# Patient Record
Sex: Female | Born: 1945 | Race: White | Hispanic: No | Marital: Single | Smoking: Never smoker
Health system: Southern US, Community
[De-identification: ages and names within clinical notes are randomized; demographics above are authoritative.]

## PROBLEM LIST (undated history)

## (undated) DIAGNOSIS — R7303 Prediabetes: Secondary | ICD-10-CM

## (undated) DIAGNOSIS — I10 Essential (primary) hypertension: Secondary | ICD-10-CM

## (undated) DIAGNOSIS — F419 Anxiety disorder, unspecified: Secondary | ICD-10-CM

## (undated) DIAGNOSIS — E785 Hyperlipidemia, unspecified: Secondary | ICD-10-CM

## (undated) DIAGNOSIS — Z86010 Personal history of colonic polyps: Secondary | ICD-10-CM

## (undated) HISTORY — PX: BREAST BIOPSY: SHX20

## (undated) HISTORY — DX: Personal history of colonic polyps: Z86.010

## (undated) HISTORY — DX: Prediabetes: R73.03

## (undated) HISTORY — DX: Hyperlipidemia, unspecified: E78.5

## (undated) HISTORY — DX: Anxiety disorder, unspecified: F41.9

## (undated) HISTORY — PX: OTHER SURGICAL HISTORY: SHX169

## (undated) HISTORY — PX: COLONOSCOPY: SHX174

## (undated) HISTORY — PX: TONSILLECTOMY AND ADENOIDECTOMY: SHX28

## (undated) HISTORY — DX: Essential (primary) hypertension: I10

---

## 1998-02-11 ENCOUNTER — Other Ambulatory Visit: Admission: RE | Admit: 1998-02-11 | Discharge: 1998-02-11 | Payer: Self-pay | Admitting: *Deleted

## 1999-04-20 ENCOUNTER — Other Ambulatory Visit: Admission: RE | Admit: 1999-04-20 | Discharge: 1999-04-20 | Payer: Self-pay | Admitting: *Deleted

## 1999-09-21 ENCOUNTER — Encounter: Admission: RE | Admit: 1999-09-21 | Discharge: 1999-09-21 | Payer: Self-pay | Admitting: *Deleted

## 1999-09-21 ENCOUNTER — Encounter: Payer: Self-pay | Admitting: *Deleted

## 2000-05-16 ENCOUNTER — Other Ambulatory Visit: Admission: RE | Admit: 2000-05-16 | Discharge: 2000-05-16 | Payer: Self-pay | Admitting: *Deleted

## 2000-09-27 ENCOUNTER — Encounter: Admission: RE | Admit: 2000-09-27 | Discharge: 2000-09-27 | Payer: Self-pay | Admitting: *Deleted

## 2000-09-27 ENCOUNTER — Encounter: Payer: Self-pay | Admitting: *Deleted

## 2001-10-09 ENCOUNTER — Encounter: Admission: RE | Admit: 2001-10-09 | Discharge: 2001-10-09 | Payer: Self-pay | Admitting: Internal Medicine

## 2001-10-09 ENCOUNTER — Encounter: Payer: Self-pay | Admitting: *Deleted

## 2002-02-21 ENCOUNTER — Other Ambulatory Visit: Admission: RE | Admit: 2002-02-21 | Discharge: 2002-02-21 | Payer: Self-pay | Admitting: *Deleted

## 2003-01-08 ENCOUNTER — Encounter: Admission: RE | Admit: 2003-01-08 | Discharge: 2003-01-08 | Payer: Self-pay | Admitting: *Deleted

## 2003-01-08 ENCOUNTER — Encounter: Payer: Self-pay | Admitting: *Deleted

## 2003-02-24 ENCOUNTER — Other Ambulatory Visit: Admission: RE | Admit: 2003-02-24 | Discharge: 2003-02-24 | Payer: Self-pay | Admitting: *Deleted

## 2004-01-27 ENCOUNTER — Encounter: Admission: RE | Admit: 2004-01-27 | Discharge: 2004-01-27 | Payer: Self-pay | Admitting: *Deleted

## 2004-04-15 ENCOUNTER — Other Ambulatory Visit: Admission: RE | Admit: 2004-04-15 | Discharge: 2004-04-15 | Payer: Self-pay | Admitting: *Deleted

## 2005-02-10 ENCOUNTER — Encounter: Admission: RE | Admit: 2005-02-10 | Discharge: 2005-02-10 | Payer: Self-pay | Admitting: *Deleted

## 2005-04-18 ENCOUNTER — Other Ambulatory Visit: Admission: RE | Admit: 2005-04-18 | Discharge: 2005-04-18 | Payer: Self-pay | Admitting: *Deleted

## 2005-11-07 ENCOUNTER — Ambulatory Visit: Payer: Self-pay | Admitting: Internal Medicine

## 2005-11-21 ENCOUNTER — Encounter (INDEPENDENT_AMBULATORY_CARE_PROVIDER_SITE_OTHER): Payer: Self-pay | Admitting: Specialist

## 2005-11-21 ENCOUNTER — Ambulatory Visit: Payer: Self-pay | Admitting: Internal Medicine

## 2005-11-30 ENCOUNTER — Ambulatory Visit: Payer: Self-pay | Admitting: Internal Medicine

## 2006-01-12 ENCOUNTER — Ambulatory Visit: Payer: Self-pay | Admitting: Internal Medicine

## 2006-02-24 ENCOUNTER — Encounter: Admission: RE | Admit: 2006-02-24 | Discharge: 2006-02-24 | Payer: Self-pay | Admitting: Internal Medicine

## 2006-05-17 ENCOUNTER — Other Ambulatory Visit: Admission: RE | Admit: 2006-05-17 | Discharge: 2006-05-17 | Payer: Self-pay | Admitting: *Deleted

## 2007-02-26 ENCOUNTER — Encounter: Admission: RE | Admit: 2007-02-26 | Discharge: 2007-02-26 | Payer: Self-pay | Admitting: *Deleted

## 2007-05-23 ENCOUNTER — Other Ambulatory Visit: Admission: RE | Admit: 2007-05-23 | Discharge: 2007-05-23 | Payer: Self-pay | Admitting: *Deleted

## 2008-02-28 ENCOUNTER — Encounter: Admission: RE | Admit: 2008-02-28 | Discharge: 2008-02-28 | Payer: Self-pay | Admitting: Gynecology

## 2008-05-22 ENCOUNTER — Other Ambulatory Visit: Admission: RE | Admit: 2008-05-22 | Discharge: 2008-05-22 | Payer: Self-pay | Admitting: Gynecology

## 2009-03-02 ENCOUNTER — Encounter: Admission: RE | Admit: 2009-03-02 | Discharge: 2009-03-02 | Payer: Self-pay | Admitting: Gynecology

## 2010-03-23 ENCOUNTER — Encounter: Admission: RE | Admit: 2010-03-23 | Discharge: 2010-03-23 | Payer: Self-pay | Admitting: Gynecology

## 2010-03-29 ENCOUNTER — Encounter: Admission: RE | Admit: 2010-03-29 | Discharge: 2010-03-29 | Payer: Self-pay | Admitting: Gynecology

## 2010-12-02 ENCOUNTER — Encounter: Payer: Self-pay | Admitting: Internal Medicine

## 2010-12-07 NOTE — Letter (Signed)
Summary: Colonoscopy Letter   Gastroenterology  [address]   [place], [place] [address]   Phone: [phone]  Fax: [phone]      December 02, 2010 MRN: [id number]   Kylie Bird [address] [place], [place]  [address]   Dear Ms. Males,   According to your medical record, it is time for you to schedule a Colonoscopy. The American Cancer Society recommends this procedure as a method to detect early colon cancer. Patients with a family history of colon cancer, or a personal history of colon polyps or inflammatory bowel disease are at increased risk.  This letter has been generated based on the recommendations made at the time of your procedure. If you feel that in your particular situation this may no longer apply, please contact our office.  Please call our office at [phone] to schedule this appointment or to update your records at your earliest convenience.  Thank you for cooperating with [place] to provide you with the very best care possible.   Sincerely,   Stan Head, M.D.  [facility] [phone]

## 2011-03-03 ENCOUNTER — Other Ambulatory Visit: Payer: Self-pay | Admitting: Gynecology

## 2011-03-03 DIAGNOSIS — Z1231 Encounter for screening mammogram for malignant neoplasm of breast: Secondary | ICD-10-CM

## 2011-03-31 ENCOUNTER — Ambulatory Visit
Admission: RE | Admit: 2011-03-31 | Discharge: 2011-03-31 | Disposition: A | Discharge status: Home / Self Care | Payer: BC Managed Care – PPO | Source: Ambulatory Visit | Attending: Gynecology | Admitting: Gynecology

## 2011-03-31 DIAGNOSIS — Z1231 Encounter for screening mammogram for malignant neoplasm of breast: Secondary | ICD-10-CM

## 2011-04-05 ENCOUNTER — Other Ambulatory Visit: Payer: Self-pay | Admitting: Gynecology

## 2011-04-05 DIAGNOSIS — R928 Other abnormal and inconclusive findings on diagnostic imaging of breast: Secondary | ICD-10-CM

## 2011-04-07 ENCOUNTER — Ambulatory Visit
Admission: RE | Admit: 2011-04-07 | Discharge: 2011-04-07 | Disposition: A | Discharge status: Home / Self Care | Payer: BC Managed Care – PPO | Source: Ambulatory Visit | Attending: Gynecology | Admitting: Gynecology

## 2011-04-07 DIAGNOSIS — R928 Other abnormal and inconclusive findings on diagnostic imaging of breast: Secondary | ICD-10-CM

## 2011-06-28 ENCOUNTER — Other Ambulatory Visit: Payer: Medicare Other | Admitting: Internal Medicine

## 2011-06-28 IMAGING — MG MM DIGITAL DIAG LTD R
2 series · 2 of 2 positions shown · non-contrast
Comparison: 03/02/2009, 02/28/2008, 02/26/2007, 02/24/2006

CLINICAL DATA: The patient returns for evaluation of
calcifications in the right breast noted on recent screening study
dated 03/23/2010.

DIGITAL DIAGNOSTIC RIGHT LIMITED MAMMOGRAM WITH CAD

[R CC]
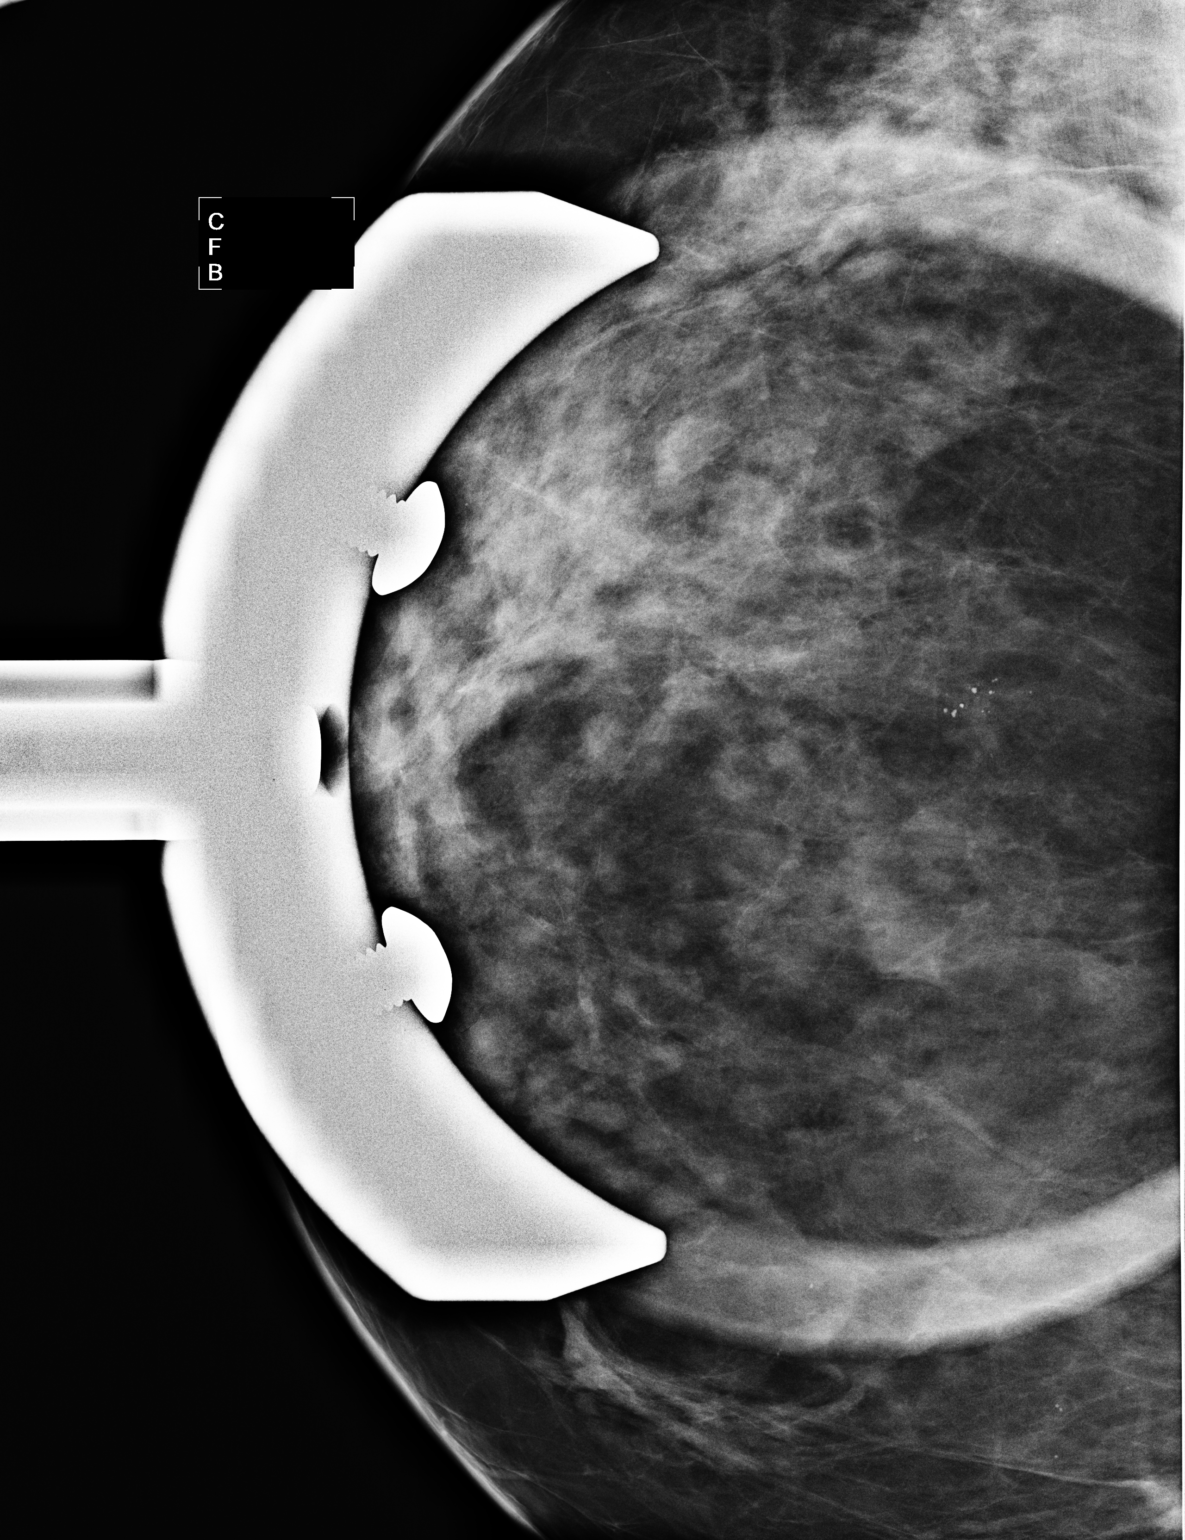

[R ML]
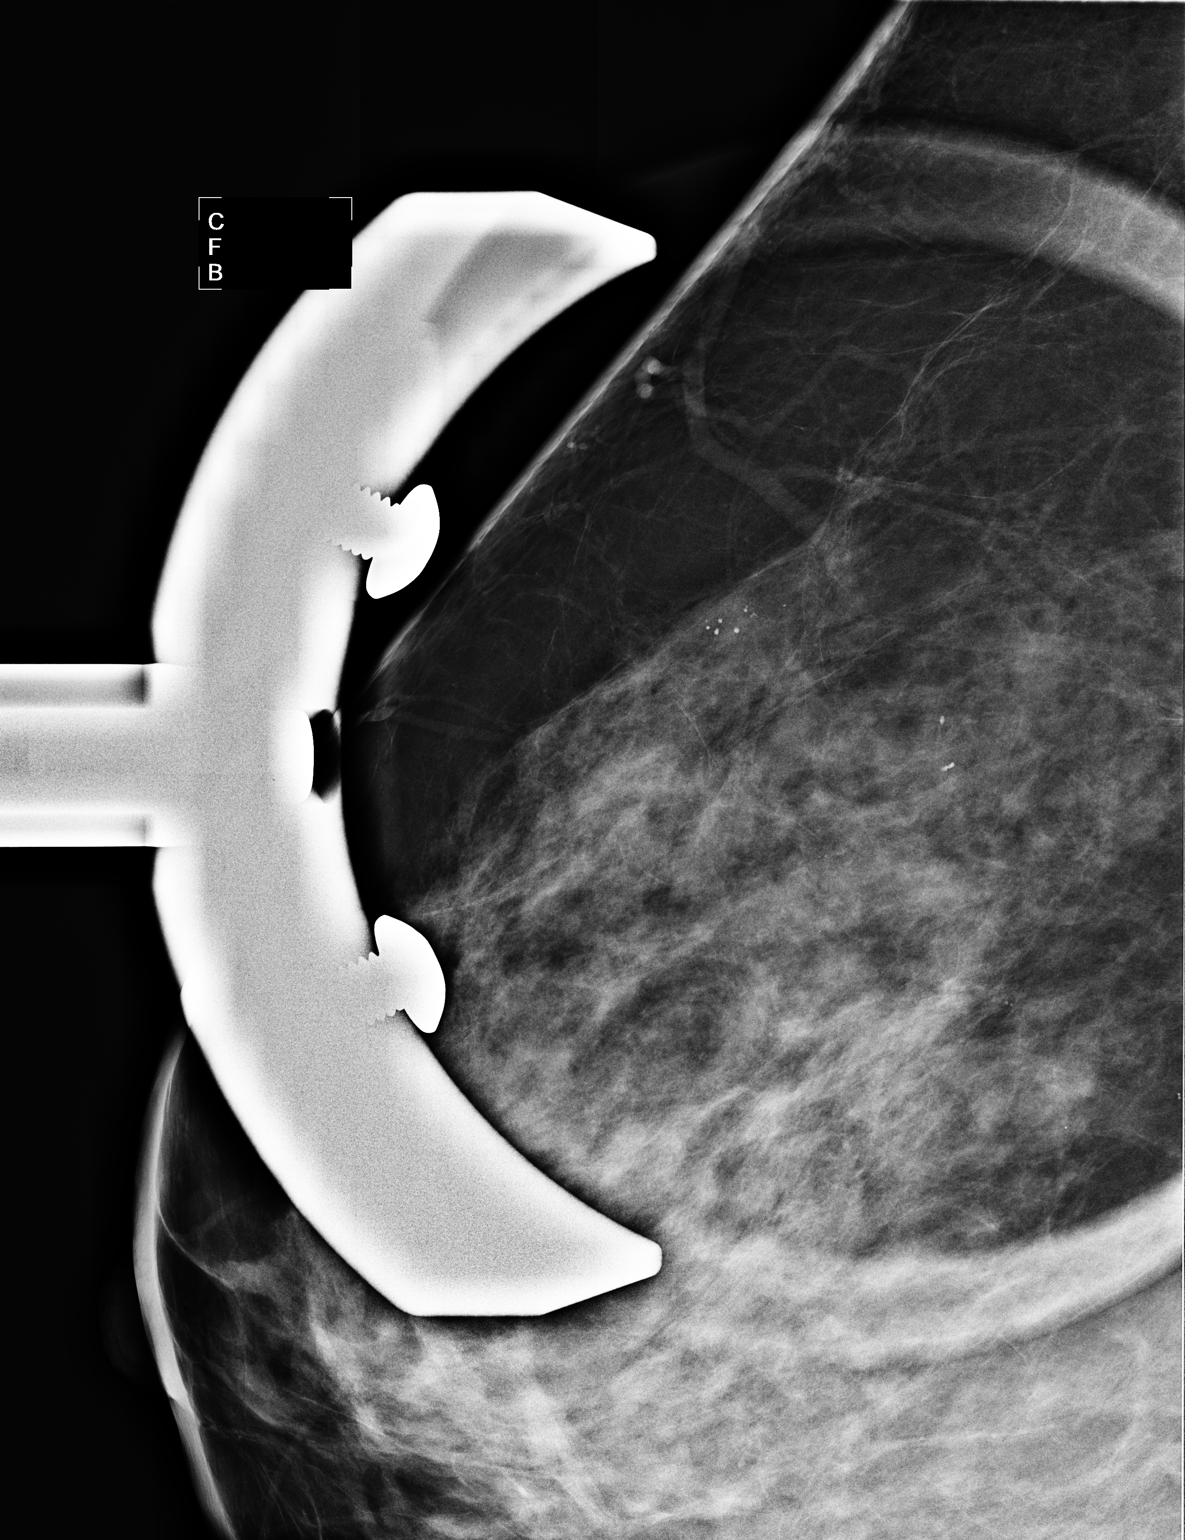

[2 of 2 positions shown; findings below may reference images not displayed]

FINDINGS: Magnification views demonstrate a small cluster of
pleomorphic microcalcifications in the 12 o'clock position of the
right breast.  The appearance is slightly suspicious and biopsy is
suggested.

Stereotactic core needle biopsy was discussed with the patient and
she agreed with this plan.
IMPRESSION: Slightly suspicious cluster of pleomorphic microcalcifications in
the 12 o'clock position of the right breast.  Stereotactic core
needle biopsy will be performed and reported separately.

BI-RADS CATEGORY 4:  Suspicious abnormality - biopsy should be
considered.

## 2011-07-26 ENCOUNTER — Ambulatory Visit (AMBULATORY_SURGERY_CENTER): Payer: BC Managed Care – PPO

## 2011-07-26 VITALS — Ht 63.0 in | Wt 152.4 lb

## 2011-07-26 DIAGNOSIS — Z860100 Personal history of colon polyps, unspecified: Secondary | ICD-10-CM

## 2011-07-26 MED ORDER — PEG-KCL-NACL-NASULF-NA ASC-C 100 G PO SOLR: 1.0000 | Freq: Once | ORAL | Status: AC

## 2011-08-09 ENCOUNTER — Ambulatory Visit (AMBULATORY_SURGERY_CENTER): Payer: Medicare Other | Admitting: Internal Medicine

## 2011-08-09 ENCOUNTER — Encounter: Payer: Self-pay | Admitting: Internal Medicine

## 2011-08-09 VITALS — BP 136/80 | HR 66 | Temp 97.0°F | Resp 16 | Ht 63.0 in | Wt 152.0 lb

## 2011-08-09 DIAGNOSIS — D126 Benign neoplasm of colon, unspecified: Secondary | ICD-10-CM

## 2011-08-09 DIAGNOSIS — Z860100 Personal history of colon polyps, unspecified: Secondary | ICD-10-CM

## 2011-08-09 DIAGNOSIS — Z1211 Encounter for screening for malignant neoplasm of colon: Secondary | ICD-10-CM

## 2011-08-09 MED ORDER — SODIUM CHLORIDE 0.9 % IV SOLN: 500.0000 mL | INTRAVENOUS | Status: DC

## 2011-08-09 NOTE — Patient Instructions (Addendum)
3 small polyps were removed today. They certainly looked benign. I will send you a letter about the results and recommendations for the next routine preventive colonoscopy. Iva Boop, MD, [facility]  SEE GREEN AND BLUE SHEETS FOR ADDITIONAL D/C INSTRUCTIONS

## 2011-08-09 NOTE — Progress Notes (Signed)
Same last name sign hanging on the IV pole. Maw  Pt tolerated the colonoscopy very well. maw

## 2011-08-09 NOTE — Progress Notes (Signed)
Patient did not experience any of the following events: a burn prior to discharge; a fall within the facility; wrong site/side/patient/procedure/implant event; or a hospital transfer or hospital admission upon discharge from the facility. (G8907) Patient did not have preoperative order for IV antibiotic SSI prophylaxis. (G8918)  

## 2011-08-10 ENCOUNTER — Telehealth: Payer: Self-pay | Admitting: *Deleted

## 2011-08-10 NOTE — Telephone Encounter (Signed)
No ID on voice mail.   No message left. 

## 2011-08-16 NOTE — Progress Notes (Signed)
Quick Note:  Adenoma and serrated adenoma - all small Repeat routine colonoscopy approx 07/2016 ______

## 2012-03-07 ENCOUNTER — Other Ambulatory Visit: Payer: Self-pay | Admitting: Gynecology

## 2012-03-07 DIAGNOSIS — Z1231 Encounter for screening mammogram for malignant neoplasm of breast: Secondary | ICD-10-CM

## 2012-04-03 ENCOUNTER — Ambulatory Visit
Admission: RE | Admit: 2012-04-03 | Discharge: 2012-04-03 | Disposition: A | Discharge status: Home / Self Care | Payer: Medicare Other | Source: Ambulatory Visit | Attending: Gynecology | Admitting: Gynecology

## 2012-04-03 DIAGNOSIS — Z1231 Encounter for screening mammogram for malignant neoplasm of breast: Secondary | ICD-10-CM

## 2013-03-08 ENCOUNTER — Other Ambulatory Visit: Payer: Self-pay

## 2013-03-08 DIAGNOSIS — Z1231 Encounter for screening mammogram for malignant neoplasm of breast: Secondary | ICD-10-CM

## 2013-04-05 ENCOUNTER — Ambulatory Visit
Admission: RE | Admit: 2013-04-05 | Discharge: 2013-04-05 | Disposition: A | Discharge status: Home / Self Care | Payer: Medicare Other | Source: Ambulatory Visit

## 2013-04-05 DIAGNOSIS — Z1231 Encounter for screening mammogram for malignant neoplasm of breast: Secondary | ICD-10-CM

## 2014-03-10 ENCOUNTER — Other Ambulatory Visit: Payer: Self-pay

## 2014-03-10 DIAGNOSIS — Z1231 Encounter for screening mammogram for malignant neoplasm of breast: Secondary | ICD-10-CM

## 2014-04-08 ENCOUNTER — Encounter (INDEPENDENT_AMBULATORY_CARE_PROVIDER_SITE_OTHER): Payer: Self-pay

## 2014-04-08 ENCOUNTER — Ambulatory Visit
Admission: RE | Admit: 2014-04-08 | Discharge: 2014-04-08 | Disposition: A | Discharge status: Home / Self Care | Payer: 59 | Source: Ambulatory Visit

## 2014-04-08 DIAGNOSIS — Z1231 Encounter for screening mammogram for malignant neoplasm of breast: Secondary | ICD-10-CM

## 2014-04-14 ENCOUNTER — Encounter: Payer: Self-pay | Admitting: Internal Medicine

## 2014-04-26 ENCOUNTER — Ambulatory Visit (INDEPENDENT_AMBULATORY_CARE_PROVIDER_SITE_OTHER): Payer: PRIVATE HEALTH INSURANCE | Admitting: Family Medicine

## 2014-04-26 VITALS — BP 176/90 | HR 70 | Temp 97.8°F | Resp 16 | Ht 63.0 in | Wt 155.8 lb

## 2014-04-26 DIAGNOSIS — R350 Frequency of micturition: Secondary | ICD-10-CM

## 2014-04-26 DIAGNOSIS — N3000 Acute cystitis without hematuria: Secondary | ICD-10-CM

## 2014-04-26 LAB — POCT UA - MICROSCOPIC ONLY
Casts, Ur, LPF, POC: NEGATIVE
Crystals, Ur, HPF, POC: NEGATIVE
Mucus, UA: NEGATIVE
Yeast, UA: NEGATIVE

## 2014-04-26 LAB — POCT URINALYSIS DIPSTICK
Bilirubin, UA: NEGATIVE
Glucose, UA: NEGATIVE
Ketones, UA: NEGATIVE
Nitrite, UA: NEGATIVE
Protein, UA: NEGATIVE
Spec Grav, UA: 1.01
Urobilinogen, UA: 0.2
pH, UA: 7

## 2014-04-26 MED ORDER — NITROFURANTOIN MONOHYD MACRO 100 MG PO CAPS: 100.0000 mg | ORAL_CAPSULE | Freq: Two times a day (BID) | ORAL | Status: DC

## 2014-04-26 NOTE — Patient Instructions (Signed)
Take the nitrofurantoin one twice daily for infection  Drink plenty of fluids  Take the azo for the next couple of days, 3 times daily  Return if worse or not improving  Urinary Tract Infection Urinary tract infections (UTIs) can develop anywhere along your urinary tract. Your urinary tract is your body's drainage system for removing wastes and extra water. Your urinary tract includes two kidneys, two ureters, a bladder, and a urethra. Your kidneys are a pair of bean-shaped organs. Each kidney is about the size of your fist. They are located below your ribs, one on each side of your spine. CAUSES Infections are caused by microbes, which are microscopic organisms, including fungi, viruses, and bacteria. These organisms are so small that they can only be seen through a microscope. Bacteria are the microbes that most commonly cause UTIs. SYMPTOMS  Symptoms of UTIs may vary by age and gender of the patient and by the location of the infection. Symptoms in young women typically include a frequent and intense urge to urinate and a painful, burning feeling in the bladder or urethra during urination. Older women and men are more likely to be tired, shaky, and weak and have muscle aches and abdominal pain. A fever may mean the infection is in your kidneys. Other symptoms of a kidney infection include pain in your back or sides below the ribs, nausea, and vomiting. DIAGNOSIS To diagnose a UTI, your caregiver will ask you about your symptoms. Your caregiver also will ask to provide a urine sample. The urine sample will be tested for bacteria and white blood cells. White blood cells are made by your body to help fight infection. TREATMENT  Typically, UTIs can be treated with medication. Because most UTIs are caused by a bacterial infection, they usually can be treated with the use of antibiotics. The choice of antibiotic and length of treatment depend on your symptoms and the type of bacteria causing your  infection. HOME CARE INSTRUCTIONS  If you were prescribed antibiotics, take them exactly as your caregiver instructs you. Finish the medication even if you feel better after you have only taken some of the medication.  Drink enough water and fluids to keep your urine clear or pale yellow.  Avoid caffeine, tea, and carbonated beverages. They tend to irritate your bladder.  Empty your bladder often. Avoid holding urine for long periods of time.  Empty your bladder before and after sexual intercourse.  After a bowel movement, women should cleanse from front to back. Use each tissue only once. SEEK MEDICAL CARE IF:   You have back pain.  You develop a fever.  Your symptoms do not begin to resolve within 3 days. SEEK IMMEDIATE MEDICAL CARE IF:   You have severe back pain or lower abdominal pain.  You develop chills.  You have nausea or vomiting.  You have continued burning or discomfort with urination. MAKE SURE YOU:   Understand these instructions.  Will watch your condition.  Will get help right away if you are not doing well or get worse. Document Released: 06/15/2005 Document Revised: 03/06/2012 Document Reviewed: 10/14/2011 [facility] Patient Information 2015 [place], [place]. This information is not intended to replace advice given to you by your health care provider. Make sure you discuss any questions you have with your health care provider.

## 2014-04-26 NOTE — Progress Notes (Signed)
Subjective: 68 year old lady who has a four-day history of urinary hesitancy and dysuria. She does work at [employer]. Part-time said she is retired. She's not sexually involved. Is single. No history of urinary tract infections. Wants to know what causes them.  Objective: Very pleasant lady in no acute distress. No CVA tenderness. Abdomen soft with mild suprapubic tenderness.    Results for orders placed in visit on 04/26/14  POCT URINALYSIS DIPSTICK      Result Value Ref Range   Color, UA yellow     Clarity, UA hazy     Glucose, UA neg     Bilirubin, UA neg     Ketones, UA neg     Spec Grav, UA 1.010     Blood, UA moderate     pH, UA 7.0     Protein, UA neg     Urobilinogen, UA 0.2     Nitrite, UA neg     Leukocytes, UA moderate (2+)    POCT UA - MICROSCOPIC ONLY      Result Value Ref Range   WBC, Ur, HPF, POC 20-25     RBC, urine, microscopic 8-12     Bacteria, U Microscopic 1+     Mucus, UA neg     Epithelial cells, urine per micros 2-3     Crystals, Ur, HPF, POC neg     Casts, Ur, LPF, POC neg     Yeast, UA neg     Assessment: Urinary tract infection Dysuria  Plan: Nitrofurantoin Urine culture

## 2014-04-28 LAB — URINE CULTURE: Colony Count: >=100000

## 2014-11-05 ENCOUNTER — Encounter: Payer: Self-pay | Admitting: Podiatry

## 2014-11-05 ENCOUNTER — Ambulatory Visit (INDEPENDENT_AMBULATORY_CARE_PROVIDER_SITE_OTHER): Payer: Medicare Other | Admitting: Podiatry

## 2014-11-05 VITALS — BP 157/87 | HR 78 | Resp 16 | Ht 64.0 in | Wt 154.0 lb

## 2014-11-05 DIAGNOSIS — L600 Ingrowing nail: Secondary | ICD-10-CM

## 2014-11-05 DIAGNOSIS — B351 Tinea unguium: Secondary | ICD-10-CM

## 2014-11-05 NOTE — Progress Notes (Signed)
   Subjective:    Patient ID: Kylie Bird, female    DOB: Jul 07, 1946, 69 y.o.   MRN: [id number]  HPI Comments: "I have a problem with the toenails"  Patient c/o thick, discolored 1st and 5th toenails bilateral for several months. The 1st nail right is starting to crack. Been using tea tree oil.     Review of Systems  HENT: Positive for sinus pressure.   Allergic/Immunologic: Positive for environmental allergies.  All other systems reviewed and are negative.      Objective:   Physical Exam        Assessment & Plan:

## 2014-11-05 NOTE — Progress Notes (Signed)
Subjective:     Patient ID: Kylie Bird, female   DOB: 03-31-1946, 69 y.o.   MRN: [id number]  HPI patient states that the right big toenail is starting to crack it's thick and it's concerned that it may be ingrown. She does not remember specific trauma and states she also gets some yellow discoloration of the little nails but they are not cracked   Review of Systems  All other systems reviewed and are negative.      Objective:   Physical Exam  Cardiovascular: Intact distal pulses.   Musculoskeletal: Normal range of motion.  Neurological: She is alert.  Skin: Skin is warm and dry.  Nursing note and vitals reviewed.  neurovascular status intact with muscle strength adequate and range of motion subtalar midtarsal joint within normal limits. Patient is noted to have a crack right hallux nail on the distal one half with yellow like discoloration that is present and no erythema or drainage noted. 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 Discussed nature of problem and today we will start topical antifungal treatment and allow it to grow out. Explained that she may lose the nail and that it may become deformed long-term and she may require laser or other treatment

## 2015-03-11 ENCOUNTER — Other Ambulatory Visit: Payer: Self-pay

## 2015-03-11 DIAGNOSIS — Z1231 Encounter for screening mammogram for malignant neoplasm of breast: Secondary | ICD-10-CM

## 2015-04-15 ENCOUNTER — Ambulatory Visit
Admission: RE | Admit: 2015-04-15 | Discharge: 2015-04-15 | Disposition: A | Discharge status: Home / Self Care | Payer: Medicare Other | Source: Ambulatory Visit

## 2015-04-15 DIAGNOSIS — Z1231 Encounter for screening mammogram for malignant neoplasm of breast: Secondary | ICD-10-CM

## 2015-06-17 ENCOUNTER — Ambulatory Visit (INDEPENDENT_AMBULATORY_CARE_PROVIDER_SITE_OTHER): Payer: Medicare Other | Admitting: Physician Assistant

## 2015-06-17 VITALS — BP 168/92 | HR 82 | Temp 98.2°F | Resp 18 | Ht 62.5 in | Wt 161.0 lb

## 2015-06-17 DIAGNOSIS — N3000 Acute cystitis without hematuria: Secondary | ICD-10-CM

## 2015-06-17 DIAGNOSIS — J019 Acute sinusitis, unspecified: Secondary | ICD-10-CM | POA: Diagnosis not present

## 2015-06-17 DIAGNOSIS — R300 Dysuria: Secondary | ICD-10-CM

## 2015-06-17 LAB — POCT URINALYSIS DIP (MANUAL ENTRY)
BILIRUBIN UA: NEGATIVE
Bilirubin, UA: NEGATIVE
GLUCOSE UA: NEGATIVE
Nitrite, UA: NEGATIVE
Protein Ur, POC: NEGATIVE
SPEC GRAV UA: 1.01
Urobilinogen, UA: 0.2
pH, UA: 7.5

## 2015-06-17 LAB — POC MICROSCOPIC URINALYSIS (UMFC)

## 2015-06-17 MED ORDER — NITROFURANTOIN MONOHYD MACRO 100 MG PO CAPS: 100.0000 mg | ORAL_CAPSULE | Freq: Two times a day (BID) | ORAL | Status: DC

## 2015-06-17 MED ORDER — AZITHROMYCIN 250 MG PO TABS: ORAL_TABLET | ORAL | Status: DC

## 2015-06-17 NOTE — Progress Notes (Signed)
Subjective:    Patient ID: Kylie Bird, female    DOB: 08/08/1946, 69 y.o.   MRN: [id number]  HPI Patient presents for congestion and cough that have been present for the past 2 weeks. States that she mostly has postnasal drip and has her voice sounding raspy. Has some rhinorrhea. Cough is mostly dry. Additionally endorses sinus pressure. Denies HA, otalgia, SOB, CP, N/V, fever, or wheezing. Has seasonal allergies that she is taking flonase and zyrtec for. Additionally has been using salt water in nasal passages without relief.   Adds that she had dysuria this morning, as well as, urinary frequency. Took Azo with marginal relief. Denies urgency, decreased volume, hematuria, vaginal discharge, or flank pain. Denies current sexual intercourse.  Allergies  Allergen Reactions  . Amoxicillin-Pot Clavulanate Hives, Itching and Swelling  . Penicillins Hives, Itching and Swelling  . Sulfa Antibiotics Hives, Itching and Swelling    Review of Systems As noted above.    Objective:   Physical Exam  Constitutional: She is oriented to person, place, and time. She appears well-developed and well-nourished. No distress.  Blood pressure 168/92, pulse 82, temperature 98.2 F (36.8 C), temperature source Oral, resp. rate 18, height 5' 2.5" (1.588 m), weight 161 lb (73.029 kg), SpO2 98 %.   HENT:  Head: Normocephalic and atraumatic.  Right Ear: Tympanic membrane, external ear and ear canal normal.  Left Ear: Tympanic membrane, external ear and ear canal normal.  Nose: Rhinorrhea (with erythema) present. Right sinus exhibits maxillary sinus tenderness. Right sinus exhibits no frontal sinus tenderness. Left sinus exhibits maxillary sinus tenderness. Left sinus exhibits no frontal sinus tenderness.  Mouth/Throat: Uvula is midline, oropharynx is clear and moist and mucous membranes are normal. No oropharyngeal exudate.  Eyes: Conjunctivae are normal. Pupils are equal, round, and reactive to light. Right  eye exhibits no discharge. Left eye exhibits no discharge. No scleral icterus.  Neck: Normal range of motion. Neck supple. No thyromegaly present.  Cardiovascular: Normal rate, regular rhythm and normal heart sounds.  Exam reveals no gallop and no friction rub.   No murmur heard. Pulmonary/Chest: Effort normal and breath sounds normal. No respiratory distress. She has no decreased breath sounds. She has no wheezes. She has no rhonchi. She has no rales.  Abdominal: Soft. Bowel sounds are normal. She exhibits no distension. There is no tenderness. There is no rebound and no guarding.  Lymphadenopathy:    She has no cervical adenopathy.  Neurological: She is alert and oriented to person, place, and time.  Skin: Skin is warm and dry. No rash noted. She is not diaphoretic. No erythema.   Results for orders placed or performed in visit on 06/17/15  POCT urinalysis dipstick  Result Value Ref Range   Color, UA yellow yellow   Clarity, UA clear clear   Glucose, UA negative negative   Bilirubin, UA negative negative   Ketones, POC UA negative negative   Spec Grav, UA 1.010    Blood, UA trace-intact (A) negative   pH, UA 7.5    Protein Ur, POC negative negative   Urobilinogen, UA 0.2    Nitrite, UA Negative Negative   Leukocytes, UA small (1+) (A) Negative  POCT Microscopic Urinalysis ([facility])  Result Value Ref Range   WBC,UR,HPF,POC Moderate (A) None WBC/hpf   RBC,UR,HPF,POC Few (A) None RBC/hpf   Bacteria Few (A) None   Mucus Present (A) Absent   Epithelial Cells, UR Per Microscopy Few (A) None cells/hpf  Assessment & Plan:  1. Burning with urination 2. Acute cystitis without hematuria Increase water intake.  - POCT urinalysis dipstick - POCT Microscopic Urinalysis ([facility]) - nitrofurantoin, macrocrystal-monohydrate, (MACROBID) 100 MG capsule; Take 1 capsule (100 mg total) by mouth 2 (two) times daily.  Dispense: 14 capsule; Refill: 0  3. Acute sinusitis, recurrence not specified,  unspecified location - azithromycin (ZITHROMAX) 250 MG tablet; Take 2 tabs PO x 1 dose, then 1 tab PO QD x 4 days  Dispense: 6 tablet; Refill: 0   Tishira Brewington PA-C  [facility] 06/23/2015 11:45 AM

## 2015-06-23 ENCOUNTER — Encounter: Payer: Self-pay | Admitting: Physician Assistant

## 2016-04-13 ENCOUNTER — Other Ambulatory Visit: Payer: Self-pay | Admitting: Internal Medicine

## 2016-04-13 DIAGNOSIS — Z1231 Encounter for screening mammogram for malignant neoplasm of breast: Secondary | ICD-10-CM

## 2016-04-21 ENCOUNTER — Ambulatory Visit
Admission: RE | Admit: 2016-04-21 | Discharge: 2016-04-21 | Disposition: A | Discharge status: Home / Self Care | Payer: Medicare Other | Source: Ambulatory Visit | Attending: Internal Medicine | Admitting: Internal Medicine

## 2016-04-21 DIAGNOSIS — Z1231 Encounter for screening mammogram for malignant neoplasm of breast: Secondary | ICD-10-CM

## 2016-08-19 ENCOUNTER — Encounter: Payer: Self-pay | Admitting: Internal Medicine

## 2016-08-22 ENCOUNTER — Encounter: Payer: Self-pay | Admitting: Internal Medicine

## 2016-10-11 ENCOUNTER — Ambulatory Visit (AMBULATORY_SURGERY_CENTER): Payer: Self-pay

## 2016-10-11 VITALS — Ht 63.0 in | Wt 140.2 lb

## 2016-10-11 DIAGNOSIS — Z86010 Personal history of colonic polyps: Secondary | ICD-10-CM

## 2016-10-11 NOTE — Progress Notes (Signed)
Per pt, no allergies to soy or egg products.Pt not taking any weight loss meds or using  O2 at home. 

## 2016-10-25 ENCOUNTER — Ambulatory Visit (AMBULATORY_SURGERY_CENTER): Payer: Medicare Other | Admitting: Internal Medicine

## 2016-10-25 ENCOUNTER — Encounter: Payer: Self-pay | Admitting: Internal Medicine

## 2016-10-25 VITALS — BP 104/63 | HR 62 | Temp 97.1°F | Resp 17 | Ht 63.0 in | Wt 140.0 lb

## 2016-10-25 DIAGNOSIS — D125 Benign neoplasm of sigmoid colon: Secondary | ICD-10-CM | POA: Diagnosis not present

## 2016-10-25 DIAGNOSIS — D122 Benign neoplasm of ascending colon: Secondary | ICD-10-CM

## 2016-10-25 DIAGNOSIS — Z86010 Personal history of colonic polyps: Secondary | ICD-10-CM | POA: Diagnosis not present

## 2016-10-25 MED ORDER — SODIUM CHLORIDE 0.9 % IV SOLN: 500.0000 mL | INTRAVENOUS | Status: DC

## 2016-10-25 NOTE — Patient Instructions (Addendum)
I found and removed 2 tiny polyps - all else ok. I will let you know pathology results and when to have another routine colonoscopy by mail.  I appreciate the opportunity to care for you. Gatha Mayer, MD, [facility]  Impression/Recommendations:  Polyp handout given to patient.  Repeat colonoscopy recommended.  Date to be determined after pathology report reviewed.  YOU HAD AN ENDOSCOPIC PROCEDURE TODAY AT [facility]:   Refer to the procedure report that was given to you for any specific questions about what was found during the examination.  If the procedure report does not answer your questions, please call your gastroenterologist to clarify.  If you requested that your care partner not be given the details of your procedure findings, then the procedure report has been included in a sealed envelope for you to review at your convenience later.  YOU SHOULD EXPECT: Some feelings of bloating in the abdomen. Passage of more gas than usual.  Walking can help get rid of the air that was put into your GI tract during the procedure and reduce the bloating. If you had a lower endoscopy (such as a colonoscopy or flexible sigmoidoscopy) you may notice spotting of blood in your stool or on the toilet paper. If you underwent a bowel prep for your procedure, you may not have a normal bowel movement for a few days.  Please Note:  You might notice some irritation and congestion in your nose or some drainage.  This is from the oxygen used during your procedure.  There is no need for concern and it should clear up in a day or so.  SYMPTOMS TO REPORT IMMEDIATELY:   Following lower endoscopy (colonoscopy or flexible sigmoidoscopy):  Excessive amounts of blood in the stool  Significant tenderness or worsening of abdominal pains  Swelling of the abdomen that is new, acute  Fever of 100F or higher  For urgent or emergent issues, a gastroenterologist can be reached at any hour by calling  [phone].   DIET:  We do recommend a small meal at first, but then you may proceed to your regular diet.  Drink plenty of fluids but you should avoid alcoholic beverages for 24 hours.  ACTIVITY:  You should plan to take it easy for the rest of today and you should NOT DRIVE or use heavy machinery until tomorrow (because of the sedation medicines used during the test).    FOLLOW UP: Our staff will call the number listed on your records the next business day following your procedure to check on you and address any questions or concerns that you may have regarding the information given to you following your procedure. If we do not reach you, we will leave a message.  However, if you are feeling well and you are not experiencing any problems, there is no need to return our call.  We will assume that you have returned to your regular daily activities without incident.  If any biopsies were taken you will be contacted by phone or by letter within the next 1-3 weeks.  Please call us at [phone] if you have not heard about the biopsies in 3 weeks.    SIGNATURES/CONFIDENTIALITY: You and/or your care partner have signed paperwork which will be entered into your electronic medical record.  These signatures attest to the fact that that the information above on your After Visit Summary has been reviewed and is understood.  Full responsibility of the confidentiality of this discharge  information lies with you and/or your care-partner. 

## 2016-10-25 NOTE — Op Note (Signed)
[place] Patient Name: Kylie Bird Procedure Date: 10/25/2016 9:35 AM MRN: [id number] Endoscopist: Gatha Mayer , MD Age: 71 Referring MD:  Date of Birth: [birth year] Gender: Female Account #: [id number] Procedure:                Colonoscopy Indications:              Surveillance: Personal history of adenomatous                            polyps on last colonoscopy 5 years ago Medicines:                Monitored Anesthesia Care Procedure:                Pre-Anesthesia Assessment:                           - Prior to the procedure, a History and Physical                            was performed, and patient medications and                            allergies were reviewed. The patient's tolerance of                            previous anesthesia was also reviewed. The risks                            and benefits of the procedure and the sedation                            options and risks were discussed with the patient.                            All questions were answered, and informed consent                            was obtained. Prior Anticoagulants: The patient has                            taken no previous anticoagulant or antiplatelet                            agents. ASA Grade Assessment: II - A patient with                            mild systemic disease. After reviewing the risks                            and benefits, the patient was deemed in                            satisfactory condition to undergo the procedure.  After obtaining informed consent, the colonoscope                            was passed under direct vision. Throughout the                            procedure, the patient's blood pressure, pulse, and                            oxygen saturations were monitored continuously. The                            Model CF-HQ190L [id number]) scope was introduced                            through the anus and  advanced to the the cecum,                            identified by appendiceal orifice and ileocecal                            valve. The colonoscopy was performed without                            difficulty. The patient tolerated the procedure                            well. The quality of the bowel preparation was                            excellent. The bowel preparation used was Miralax.                            The ileocecal valve, appendiceal orifice, and                            rectum were photographed. Scope In: 9:44:42 AM Scope Out: 10:02:22 AM Scope Withdrawal Time: 0 hours 14 minutes 31 seconds  Total Procedure Duration: 0 hours 17 minutes 40 seconds  Findings:                 The perianal and digital rectal examinations were                            normal.                           Two sessile polyps were found in the sigmoid colon                            and ascending colon. The polyps were diminutive in                            size. These polyps were removed with a cold snare.  Resection and retrieval were complete. Verification                            of patient identification for the specimen was                            done. Estimated blood loss was minimal.                           The exam was otherwise without abnormality on                            direct and retroflexion views. Complications:            No immediate complications. Estimated Blood Loss:     Estimated blood loss was minimal. Impression:               - Two diminutive polyps in the sigmoid colon and in                            the ascending colon, removed with a cold snare.                            Resected and retrieved.                           - The examination was otherwise normal on direct                            and retroflexion views.                           - Personal history of colonic polyps. 2012 small                             adenoms/ssp's Recommendation:           - Patient has a contact number available for                            emergencies. The signs and symptoms of potential                            delayed complications were discussed with the                            patient. Return to normal activities tomorrow.                            Written discharge instructions were provided to the                            patient.                           - Continue present medications.                           -  Repeat colonoscopy is recommended. The                            colonoscopy date will be determined after pathology                            results from today's exam become available for                            review.                           - Resume previous diet. Gatha Mayer, MD 10/25/2016 10:08:50 AM This report has been signed electronically.

## 2016-10-25 NOTE — Progress Notes (Signed)
Called to room to assist during endoscopic procedure.  Patient ID and intended procedure confirmed with present staff. Received instructions for my participation in the procedure from the performing physician.  

## 2016-10-25 NOTE — Progress Notes (Signed)
Report given to PACU, vss 

## 2016-10-26 ENCOUNTER — Telehealth: Payer: Self-pay

## 2016-10-26 NOTE — Telephone Encounter (Signed)
  Follow up Call-  Call back number 10/25/2016  Post procedure Call Back phone  # [phone]  Permission to leave phone message Yes  Some recent data might be hidden     Patient questions:  Do you have a fever, pain , or abdominal swelling? No. Pain Score  0 *  Have you tolerated food without any problems? Yes.    Have you been able to return to your normal activities? Yes.    Do you have any questions about your discharge instructions: Diet   No. Medications  No. Follow up visit  No.  Do you have questions or concerns about your Care? No.  Actions: * If pain score is 4 or above: No action needed, pain <4.  No problems per pt. maw

## 2016-11-01 ENCOUNTER — Encounter: Payer: Self-pay | Admitting: Internal Medicine

## 2016-11-01 DIAGNOSIS — Z860100 Personal history of colon polyps, unspecified: Secondary | ICD-10-CM | POA: Insufficient documentation

## 2016-11-01 HISTORY — DX: Personal history of colonic polyps: Z86.010

## 2016-11-01 HISTORY — DX: Personal history of colon polyps, unspecified: Z86.0100

## 2016-11-01 NOTE — Progress Notes (Signed)
dimin adenoma recall 2023

## 2017-03-20 ENCOUNTER — Other Ambulatory Visit: Payer: Self-pay | Admitting: Internal Medicine

## 2017-03-20 DIAGNOSIS — Z1231 Encounter for screening mammogram for malignant neoplasm of breast: Secondary | ICD-10-CM

## 2017-04-25 ENCOUNTER — Ambulatory Visit
Admission: RE | Admit: 2017-04-25 | Discharge: 2017-04-25 | Disposition: A | Discharge status: Home / Self Care | Payer: Medicare Other | Source: Ambulatory Visit | Attending: Internal Medicine | Admitting: Internal Medicine

## 2017-04-25 DIAGNOSIS — Z1231 Encounter for screening mammogram for malignant neoplasm of breast: Secondary | ICD-10-CM

## 2018-03-20 ENCOUNTER — Other Ambulatory Visit: Payer: Self-pay | Admitting: Internal Medicine

## 2018-03-20 DIAGNOSIS — Z1231 Encounter for screening mammogram for malignant neoplasm of breast: Secondary | ICD-10-CM

## 2018-04-27 ENCOUNTER — Ambulatory Visit
Admission: RE | Admit: 2018-04-27 | Discharge: 2018-04-27 | Disposition: A | Discharge status: Home / Self Care | Payer: Medicare Other | Source: Ambulatory Visit | Attending: Internal Medicine | Admitting: Internal Medicine

## 2018-04-27 DIAGNOSIS — Z1231 Encounter for screening mammogram for malignant neoplasm of breast: Secondary | ICD-10-CM

## 2019-03-19 ENCOUNTER — Other Ambulatory Visit: Payer: Self-pay | Admitting: Internal Medicine

## 2019-03-19 DIAGNOSIS — Z1231 Encounter for screening mammogram for malignant neoplasm of breast: Secondary | ICD-10-CM

## 2019-05-03 ENCOUNTER — Ambulatory Visit
Admission: RE | Admit: 2019-05-03 | Discharge: 2019-05-03 | Disposition: A | Discharge status: Home / Self Care | Payer: Medicare Other | Source: Ambulatory Visit | Attending: Internal Medicine | Admitting: Internal Medicine

## 2019-05-03 ENCOUNTER — Other Ambulatory Visit: Payer: Self-pay

## 2019-05-03 DIAGNOSIS — Z1231 Encounter for screening mammogram for malignant neoplasm of breast: Secondary | ICD-10-CM

## 2020-03-25 ENCOUNTER — Other Ambulatory Visit: Payer: Self-pay | Admitting: Internal Medicine

## 2020-03-25 DIAGNOSIS — Z1231 Encounter for screening mammogram for malignant neoplasm of breast: Secondary | ICD-10-CM

## 2020-05-04 ENCOUNTER — Ambulatory Visit
Admission: RE | Admit: 2020-05-04 | Discharge: 2020-05-04 | Disposition: A | Discharge status: Home / Self Care | Payer: Medicare Other | Source: Ambulatory Visit | Attending: Internal Medicine | Admitting: Internal Medicine

## 2020-05-04 ENCOUNTER — Other Ambulatory Visit: Payer: Self-pay

## 2020-05-04 DIAGNOSIS — Z1231 Encounter for screening mammogram for malignant neoplasm of breast: Secondary | ICD-10-CM

## 2020-05-22 DIAGNOSIS — M8580 Other specified disorders of bone density and structure, unspecified site: Secondary | ICD-10-CM | POA: Diagnosis not present

## 2020-05-22 DIAGNOSIS — R030 Elevated blood-pressure reading, without diagnosis of hypertension: Secondary | ICD-10-CM | POA: Diagnosis not present

## 2020-05-22 DIAGNOSIS — E119 Type 2 diabetes mellitus without complications: Secondary | ICD-10-CM | POA: Diagnosis not present

## 2020-05-22 DIAGNOSIS — R748 Abnormal levels of other serum enzymes: Secondary | ICD-10-CM | POA: Diagnosis not present

## 2020-05-22 DIAGNOSIS — I77819 Aortic ectasia, unspecified site: Secondary | ICD-10-CM | POA: Diagnosis not present

## 2020-05-22 DIAGNOSIS — E785 Hyperlipidemia, unspecified: Secondary | ICD-10-CM | POA: Diagnosis not present

## 2020-05-22 DIAGNOSIS — E663 Overweight: Secondary | ICD-10-CM | POA: Diagnosis not present

## 2020-05-22 DIAGNOSIS — I10 Essential (primary) hypertension: Secondary | ICD-10-CM | POA: Diagnosis not present

## 2020-06-23 DIAGNOSIS — M67372 Transient synovitis, left ankle and foot: Secondary | ICD-10-CM | POA: Diagnosis not present

## 2020-06-23 DIAGNOSIS — M19072 Primary osteoarthritis, left ankle and foot: Secondary | ICD-10-CM | POA: Diagnosis not present

## 2020-06-30 DIAGNOSIS — M67372 Transient synovitis, left ankle and foot: Secondary | ICD-10-CM | POA: Diagnosis not present

## 2020-06-30 DIAGNOSIS — M7752 Other enthesopathy of left foot: Secondary | ICD-10-CM | POA: Diagnosis not present

## 2020-07-15 DIAGNOSIS — R6889 Other general symptoms and signs: Secondary | ICD-10-CM | POA: Diagnosis not present

## 2020-07-15 DIAGNOSIS — R309 Painful micturition, unspecified: Secondary | ICD-10-CM | POA: Diagnosis not present

## 2020-07-15 DIAGNOSIS — N760 Acute vaginitis: Secondary | ICD-10-CM | POA: Diagnosis not present

## 2020-08-21 DIAGNOSIS — I10 Essential (primary) hypertension: Secondary | ICD-10-CM | POA: Diagnosis not present

## 2020-08-21 DIAGNOSIS — M8580 Other specified disorders of bone density and structure, unspecified site: Secondary | ICD-10-CM | POA: Diagnosis not present

## 2020-08-21 DIAGNOSIS — E785 Hyperlipidemia, unspecified: Secondary | ICD-10-CM | POA: Diagnosis not present

## 2020-08-21 DIAGNOSIS — E119 Type 2 diabetes mellitus without complications: Secondary | ICD-10-CM | POA: Diagnosis not present

## 2020-08-21 DIAGNOSIS — R030 Elevated blood-pressure reading, without diagnosis of hypertension: Secondary | ICD-10-CM | POA: Diagnosis not present

## 2020-08-21 DIAGNOSIS — R748 Abnormal levels of other serum enzymes: Secondary | ICD-10-CM | POA: Diagnosis not present

## 2020-08-21 DIAGNOSIS — I77819 Aortic ectasia, unspecified site: Secondary | ICD-10-CM | POA: Diagnosis not present

## 2020-08-21 DIAGNOSIS — E663 Overweight: Secondary | ICD-10-CM | POA: Diagnosis not present

## 2020-09-01 DIAGNOSIS — Z01419 Encounter for gynecological examination (general) (routine) without abnormal findings: Secondary | ICD-10-CM | POA: Diagnosis not present

## 2020-09-01 DIAGNOSIS — Z6826 Body mass index (BMI) 26.0-26.9, adult: Secondary | ICD-10-CM | POA: Diagnosis not present

## 2020-09-30 DIAGNOSIS — R300 Dysuria: Secondary | ICD-10-CM | POA: Diagnosis not present

## 2020-09-30 DIAGNOSIS — N390 Urinary tract infection, site not specified: Secondary | ICD-10-CM | POA: Diagnosis not present

## 2020-10-23 DIAGNOSIS — H5213 Myopia, bilateral: Secondary | ICD-10-CM | POA: Diagnosis not present

## 2020-10-23 DIAGNOSIS — H25099 Other age-related incipient cataract, unspecified eye: Secondary | ICD-10-CM | POA: Diagnosis not present

## 2020-10-23 DIAGNOSIS — H52223 Regular astigmatism, bilateral: Secondary | ICD-10-CM | POA: Diagnosis not present

## 2020-10-23 DIAGNOSIS — H524 Presbyopia: Secondary | ICD-10-CM | POA: Diagnosis not present

## 2020-10-23 DIAGNOSIS — H43392 Other vitreous opacities, left eye: Secondary | ICD-10-CM | POA: Diagnosis not present

## 2021-02-11 DIAGNOSIS — R0981 Nasal congestion: Secondary | ICD-10-CM | POA: Diagnosis not present

## 2021-02-11 DIAGNOSIS — U071 COVID-19: Secondary | ICD-10-CM | POA: Diagnosis not present

## 2021-03-09 DIAGNOSIS — M859 Disorder of bone density and structure, unspecified: Secondary | ICD-10-CM | POA: Diagnosis not present

## 2021-03-09 DIAGNOSIS — E785 Hyperlipidemia, unspecified: Secondary | ICD-10-CM | POA: Diagnosis not present

## 2021-03-09 DIAGNOSIS — E119 Type 2 diabetes mellitus without complications: Secondary | ICD-10-CM | POA: Diagnosis not present

## 2021-03-16 DIAGNOSIS — E785 Hyperlipidemia, unspecified: Secondary | ICD-10-CM | POA: Diagnosis not present

## 2021-03-16 DIAGNOSIS — Z23 Encounter for immunization: Secondary | ICD-10-CM | POA: Diagnosis not present

## 2021-03-16 DIAGNOSIS — R030 Elevated blood-pressure reading, without diagnosis of hypertension: Secondary | ICD-10-CM | POA: Diagnosis not present

## 2021-03-16 DIAGNOSIS — Z0000 Encounter for general adult medical examination without abnormal findings: Secondary | ICD-10-CM | POA: Diagnosis not present

## 2021-03-16 DIAGNOSIS — I10 Essential (primary) hypertension: Secondary | ICD-10-CM | POA: Diagnosis not present

## 2021-03-16 DIAGNOSIS — E663 Overweight: Secondary | ICD-10-CM | POA: Diagnosis not present

## 2021-03-16 DIAGNOSIS — R82998 Other abnormal findings in urine: Secondary | ICD-10-CM | POA: Diagnosis not present

## 2021-03-16 DIAGNOSIS — E119 Type 2 diabetes mellitus without complications: Secondary | ICD-10-CM | POA: Diagnosis not present

## 2021-03-16 DIAGNOSIS — M8580 Other specified disorders of bone density and structure, unspecified site: Secondary | ICD-10-CM | POA: Diagnosis not present

## 2021-03-16 DIAGNOSIS — Z1212 Encounter for screening for malignant neoplasm of rectum: Secondary | ICD-10-CM | POA: Diagnosis not present

## 2021-03-16 DIAGNOSIS — I77819 Aortic ectasia, unspecified site: Secondary | ICD-10-CM | POA: Diagnosis not present

## 2021-03-29 ENCOUNTER — Other Ambulatory Visit: Payer: Self-pay | Admitting: Internal Medicine

## 2021-03-29 DIAGNOSIS — Z1231 Encounter for screening mammogram for malignant neoplasm of breast: Secondary | ICD-10-CM

## 2021-05-21 ENCOUNTER — Other Ambulatory Visit: Payer: Self-pay

## 2021-05-21 ENCOUNTER — Ambulatory Visit
Admission: RE | Admit: 2021-05-21 | Discharge: 2021-05-21 | Disposition: A | Discharge status: Home / Self Care | Payer: Medicare PPO | Source: Ambulatory Visit | Attending: Internal Medicine | Admitting: Internal Medicine

## 2021-05-21 DIAGNOSIS — Z1231 Encounter for screening mammogram for malignant neoplasm of breast: Secondary | ICD-10-CM

## 2021-06-01 DIAGNOSIS — B351 Tinea unguium: Secondary | ICD-10-CM | POA: Diagnosis not present

## 2021-09-02 DIAGNOSIS — Z124 Encounter for screening for malignant neoplasm of cervix: Secondary | ICD-10-CM | POA: Diagnosis not present

## 2021-09-02 DIAGNOSIS — Z6828 Body mass index (BMI) 28.0-28.9, adult: Secondary | ICD-10-CM | POA: Diagnosis not present

## 2021-09-02 DIAGNOSIS — R300 Dysuria: Secondary | ICD-10-CM | POA: Diagnosis not present

## 2021-09-28 DIAGNOSIS — E785 Hyperlipidemia, unspecified: Secondary | ICD-10-CM | POA: Diagnosis not present

## 2021-09-28 DIAGNOSIS — F419 Anxiety disorder, unspecified: Secondary | ICD-10-CM | POA: Diagnosis not present

## 2021-09-28 DIAGNOSIS — R748 Abnormal levels of other serum enzymes: Secondary | ICD-10-CM | POA: Diagnosis not present

## 2021-09-28 DIAGNOSIS — M8580 Other specified disorders of bone density and structure, unspecified site: Secondary | ICD-10-CM | POA: Diagnosis not present

## 2021-09-28 DIAGNOSIS — M21612 Bunion of left foot: Secondary | ICD-10-CM | POA: Diagnosis not present

## 2021-09-28 DIAGNOSIS — I10 Essential (primary) hypertension: Secondary | ICD-10-CM | POA: Diagnosis not present

## 2021-09-28 DIAGNOSIS — E663 Overweight: Secondary | ICD-10-CM | POA: Diagnosis not present

## 2021-09-28 DIAGNOSIS — I77819 Aortic ectasia, unspecified site: Secondary | ICD-10-CM | POA: Diagnosis not present

## 2021-09-28 DIAGNOSIS — E119 Type 2 diabetes mellitus without complications: Secondary | ICD-10-CM | POA: Diagnosis not present

## 2021-10-13 DIAGNOSIS — H1013 Acute atopic conjunctivitis, bilateral: Secondary | ICD-10-CM | POA: Diagnosis not present

## 2021-10-13 DIAGNOSIS — H04123 Dry eye syndrome of bilateral lacrimal glands: Secondary | ICD-10-CM | POA: Diagnosis not present

## 2021-10-13 DIAGNOSIS — H2513 Age-related nuclear cataract, bilateral: Secondary | ICD-10-CM | POA: Diagnosis not present

## 2021-11-03 DIAGNOSIS — H1013 Acute atopic conjunctivitis, bilateral: Secondary | ICD-10-CM | POA: Diagnosis not present

## 2021-11-03 DIAGNOSIS — H25813 Combined forms of age-related cataract, bilateral: Secondary | ICD-10-CM | POA: Diagnosis not present

## 2021-11-03 DIAGNOSIS — H43812 Vitreous degeneration, left eye: Secondary | ICD-10-CM | POA: Diagnosis not present

## 2021-11-03 DIAGNOSIS — H04123 Dry eye syndrome of bilateral lacrimal glands: Secondary | ICD-10-CM | POA: Diagnosis not present

## 2022-01-21 ENCOUNTER — Encounter: Payer: Self-pay | Admitting: Internal Medicine

## 2022-02-09 ENCOUNTER — Encounter: Payer: Self-pay | Admitting: Internal Medicine

## 2022-03-07 ENCOUNTER — Ambulatory Visit (AMBULATORY_SURGERY_CENTER): Payer: Self-pay | Admitting: *Deleted

## 2022-03-07 VITALS — Ht 61.5 in | Wt 149.4 lb

## 2022-03-07 DIAGNOSIS — Z86010 Personal history of colonic polyps: Secondary | ICD-10-CM

## 2022-03-07 MED ORDER — NA SULFATE-K SULFATE-MG SULF 17.5-3.13-1.6 GM/177ML PO SOLN: 2.0000 | Freq: Once | ORAL | 0 refills | Status: AC

## 2022-03-07 NOTE — Progress Notes (Signed)
No egg or soy allergy known to patient  No issues known to pt with past sedation with any surgeries or procedures Patient denies ever being told they had issues or difficulty with intubation  No FH of Malignant Hyperthermia Pt is not on diet pills Pt is not on  home 02  Pt is not on blood thinners  Pt denies issues with constipation  No A fib or A flutter   Discussed with pt there will be an out-of-pocket cost for prep and that varies from $0 to 70 +  dollars - pt verbalized understanding  Pt instructed to use Singlecare.com or GoodRx for a price reduction on prep   PV completed in person. Pt verified name, DOB, address and insurance during PV today.  Pt given instruction packet with copy of consent form to read and sign after procedure and instructions explained and questions answered. Pt encouraged to call with questions or issues.  If pt has My chart, procedure instructions sent via My Chart

## 2022-03-30 DIAGNOSIS — R739 Hyperglycemia, unspecified: Secondary | ICD-10-CM | POA: Diagnosis not present

## 2022-03-30 DIAGNOSIS — E785 Hyperlipidemia, unspecified: Secondary | ICD-10-CM | POA: Diagnosis not present

## 2022-03-30 DIAGNOSIS — Z79899 Other long term (current) drug therapy: Secondary | ICD-10-CM | POA: Diagnosis not present

## 2022-03-30 DIAGNOSIS — I10 Essential (primary) hypertension: Secondary | ICD-10-CM | POA: Diagnosis not present

## 2022-03-30 DIAGNOSIS — F419 Anxiety disorder, unspecified: Secondary | ICD-10-CM | POA: Diagnosis not present

## 2022-03-30 DIAGNOSIS — R7989 Other specified abnormal findings of blood chemistry: Secondary | ICD-10-CM | POA: Diagnosis not present

## 2022-03-31 ENCOUNTER — Encounter: Payer: Self-pay | Admitting: Internal Medicine

## 2022-04-04 ENCOUNTER — Ambulatory Visit (AMBULATORY_SURGERY_CENTER): Payer: Medicare PPO | Admitting: Internal Medicine

## 2022-04-04 ENCOUNTER — Encounter: Payer: Self-pay | Admitting: Internal Medicine

## 2022-04-04 VITALS — BP 101/53 | HR 56 | Temp 98.2°F | Resp 17 | Ht 61.0 in | Wt 149.0 lb

## 2022-04-04 DIAGNOSIS — D125 Benign neoplasm of sigmoid colon: Secondary | ICD-10-CM | POA: Diagnosis not present

## 2022-04-04 DIAGNOSIS — E669 Obesity, unspecified: Secondary | ICD-10-CM | POA: Diagnosis not present

## 2022-04-04 DIAGNOSIS — Z09 Encounter for follow-up examination after completed treatment for conditions other than malignant neoplasm: Secondary | ICD-10-CM

## 2022-04-04 DIAGNOSIS — Z86010 Personal history of colonic polyps: Secondary | ICD-10-CM | POA: Diagnosis not present

## 2022-04-04 DIAGNOSIS — D127 Benign neoplasm of rectosigmoid junction: Secondary | ICD-10-CM | POA: Diagnosis not present

## 2022-04-04 DIAGNOSIS — D128 Benign neoplasm of rectum: Secondary | ICD-10-CM

## 2022-04-04 DIAGNOSIS — I10 Essential (primary) hypertension: Secondary | ICD-10-CM | POA: Diagnosis not present

## 2022-04-04 MED ORDER — SODIUM CHLORIDE 0.9 % IV SOLN: 500.0000 mL | Freq: Once | INTRAVENOUS | Status: DC

## 2022-04-04 NOTE — Progress Notes (Signed)
Called to room to assist during endoscopic procedure.  Patient ID and intended procedure confirmed with present staff. Received instructions for my participation in the procedure from the performing physician.  

## 2022-04-04 NOTE — Patient Instructions (Addendum)
I removed 2 tiny polyps.  I also saw some diverticulosis.  I do not think you will need another routine colonoscopy - I will get the polyps analyzed and let you know.  I appreciate the opportunity to care for you. Gatha Mayer, MD, [facility]  Handouts provided on polyps and diverticulosis.   YOU HAD AN ENDOSCOPIC PROCEDURE TODAY AT [facility]:   Refer to the procedure report that was given to you for any specific questions about what was found during the examination.  If the procedure report does not answer your questions, please call your gastroenterologist to clarify.  If you requested that your care partner not be given the details of your procedure findings, then the procedure report has been included in a sealed envelope for you to review at your convenience later.  YOU SHOULD EXPECT: Some feelings of bloating in the abdomen. Passage of more gas than usual.  Walking can help get rid of the air that was put into your GI tract during the procedure and reduce the bloating. If you had a lower endoscopy (such as a colonoscopy or flexible sigmoidoscopy) you may notice spotting of blood in your stool or on the toilet paper. If you underwent a bowel prep for your procedure, you may not have a normal bowel movement for a few days.  Please Note:  You might notice some irritation and congestion in your nose or some drainage.  This is from the oxygen used during your procedure.  There is no need for concern and it should clear up in a day or so.  SYMPTOMS TO REPORT IMMEDIATELY:  Following lower endoscopy (colonoscopy or flexible sigmoidoscopy):  Excessive amounts of blood in the stool  Significant tenderness or worsening of abdominal pains  Swelling of the abdomen that is new, acute  Fever of 100F or higher  For urgent or emergent issues, a gastroenterologist can be reached at any hour by calling [phone]. Do not use MyChart messaging for urgent concerns.    DIET:  We do  recommend a small meal at first, but then you may proceed to your regular diet.  Drink plenty of fluids but you should avoid alcoholic beverages for 24 hours.  ACTIVITY:  You should plan to take it easy for the rest of today and you should NOT DRIVE or use heavy machinery until tomorrow (because of the sedation medicines used during the test).    FOLLOW UP: Our staff will call the number listed on your records the next business day following your procedure.  We will call around 7:15- 8:00 am to check on you and address any questions or concerns that you may have regarding the information given to you following your procedure. If we do not reach you, we will leave a message.  If you develop any symptoms (ie: fever, flu-like symptoms, shortness of breath, cough etc.) before then, please call [phone].  If you test positive for Covid 19 in the 2 weeks post procedure, please call and report this information to [place].    If any biopsies were taken you will be contacted by phone or by letter within the next 1-3 weeks.  Please call us at [phone] if you have not heard about the biopsies in 3 weeks.    SIGNATURES/CONFIDENTIALITY: You and/or your care partner have signed paperwork which will be entered into your electronic medical record.  These signatures attest to the fact that that the information above on your After Visit Summary  has been reviewed and is understood.  Full responsibility of the confidentiality of this discharge information lies with you and/or your care-partner.

## 2022-04-04 NOTE — Progress Notes (Signed)
Pt's states no medical or surgical changes since previsit or office visit. 

## 2022-04-04 NOTE — Progress Notes (Signed)
[facility] History and Physical   Primary Care Physician:  Shon Baton, MD   Reason for Procedure:   Hx polyps  Plan:    colonoscopy     HPI: Kylie Bird is a 76 y.o. female w/ prior adenomas/ssp's - 2012 and diminutive adenoma 2018   Past Medical History:  Diagnosis Date   Anxiety    Hx of colonic polyps 11/01/2016   Hyperlipidemia    Hypertension     Past Surgical History:  Procedure Laterality Date   BREAST BIOPSY     left breast and right breast/ benign   COLONOSCOPY  2007, 08/09/2011   2007 - small adenoma   TONSILLECTOMY AND ADENOIDECTOMY      Prior to Admission medications   Medication Sig Start Date End Date Taking? Authorizing Provider  Barberry-Oreg Grape-Goldenseal (BERBERINE COMPLEX) 200-200-50 MG CAPS take one capsule Orally bid   Yes [provider]  Calcium Carbonate (CALTRATE 600 PO) Take by mouth 2 (two) times daily. Take 2 calcium caltrate bid    Yes [provider]  Cholecalciferol (VITAMIN D-3 PO) Take by mouth daily. Take 5000 units daily    Yes [provider]  Coenzyme Q10 (CO Q10) 100 MG CAPS Take by mouth. Take 300 mg daily    Yes [provider]  Garlic 017 MG CAPS as directed Orally daily   Yes [provider]  lisinopril (PRINIVIL,ZESTRIL) 10 MG tablet Take 10 mg by mouth at bedtime.   Yes [provider]  Magnesium 400 MG CAPS 1 capsule with a meal Orally bid   Yes [provider]  Multiple Vitamin (MULTIVITAMIN) tablet Take 1 tablet by mouth daily.     Yes [provider]  cetirizine (ZYRTEC ALLERGY) 10 MG tablet one po every day as needed Oral 12/01/11   [provider]  colesevelam (WELCHOL) 625 MG tablet Take 1,875 mg by mouth. Take 3 pills bid  Patient not taking: Reported on 03/07/2022    [provider]  cromolyn (OPTICROM) 4 % ophthalmic solution 1 drop 4 (four) times daily. 10/13/21   [provider]  fish oil-omega-3  fatty acids 1000 MG capsule Take by mouth. Take 2 caps BID    [provider]  fluticasone (FLONASE) 50 MCG/ACT nasal spray Place 2 sprays into both nostrils as needed.     [provider]  LORazepam (ATIVAN) 1 MG tablet SMARTSIG:0.5-1 Tablet(s) By Mouth 1-2 Times Daily PRN Patient not taking: Reported on 04/04/2022 09/15/21   [provider]  Nutritional Supplements (JUICE PLUS FIBRE PO) Juice Plus    [provider]  Red Yeast Rice Extract (RED YEAST RICE PO) Take by mouth. Take 2 regular pills and 1 extended release at bedtime Patient not taking: Reported on 03/07/2022    [provider]  Triamcinolone Acetonide (NASACORT AQ NA) Place into the nose as needed.  Patient not taking: Reported on 03/07/2022    [provider]  Zinc 10 MG LOZG zinc    [provider]    Current Outpatient Medications  Medication Sig Dispense Refill   Barberry-Oreg Grape-Goldenseal (BERBERINE COMPLEX) 200-200-50 MG CAPS take one capsule Orally bid     Calcium Carbonate (CALTRATE 600 PO) Take by mouth 2 (two) times daily. Take 2 calcium caltrate bid      Cholecalciferol (VITAMIN D-3 PO) Take by mouth daily. Take 5000 units daily      Coenzyme Q10 (CO Q10) 100 MG CAPS Take by mouth. Take 300 mg  daily      Garlic 938 MG CAPS as directed Orally daily     lisinopril (PRINIVIL,ZESTRIL) 10 MG tablet Take 10 mg by mouth at bedtime.     Magnesium 400 MG CAPS 1 capsule with a meal Orally bid     Multiple Vitamin (MULTIVITAMIN) tablet Take 1 tablet by mouth daily.       cetirizine (ZYRTEC ALLERGY) 10 MG tablet one po every day as needed Oral     colesevelam (WELCHOL) 625 MG tablet Take 1,875 mg by mouth. Take 3 pills bid  (Patient not taking: Reported on 03/07/2022)     cromolyn (OPTICROM) 4 % ophthalmic solution 1 drop 4 (four) times daily.     fish oil-omega-3 fatty acids 1000 MG capsule Take by mouth. Take 2 caps BID     fluticasone (FLONASE) 50 MCG/ACT nasal  spray Place 2 sprays into both nostrils as needed.      LORazepam (ATIVAN) 1 MG tablet SMARTSIG:0.5-1 Tablet(s) By Mouth 1-2 Times Daily PRN (Patient not taking: Reported on 04/04/2022)     Nutritional Supplements (JUICE PLUS FIBRE PO) Juice Plus     Red Yeast Rice Extract (RED YEAST RICE PO) Take by mouth. Take 2 regular pills and 1 extended release at bedtime (Patient not taking: Reported on 03/07/2022)     Triamcinolone Acetonide (NASACORT AQ NA) Place into the nose as needed.  (Patient not taking: Reported on 03/07/2022)     Zinc 10 MG LOZG zinc     Current Facility-Administered Medications  Medication Dose Route Frequency Provider Last Rate Last Admin   0.9 %  sodium chloride infusion  500 mL Intravenous Continuous Gatha Mayer, MD       0.9 %  sodium chloride infusion  500 mL Intravenous Once Gatha Mayer, MD        Allergies as of 04/04/2022 - Review Complete 04/04/2022  Allergen Reaction Noted   Augmentin [amoxicillin-pot clavulanate] Hives, Itching, and Swelling 07/26/2011   Penicillins Hives, Itching, and Swelling 07/26/2011   Sulfa antibiotics Hives, Itching, and Swelling 07/26/2011    Family History  Problem Relation Age of Onset   Diabetes Mother    Heart disease Mother    Tongue cancer Mother    Leukemia Father    Breast cancer Neg Hx    Colon cancer Neg Hx    Colon polyps Neg Hx    Esophageal cancer Neg Hx    Stomach cancer Neg Hx    Rectal cancer Neg Hx     Social History   Socioeconomic History   Marital status: Single    Spouse name: Not on file   Number of children: Not on file   Years of education: Not on file   Highest education level: Not on file  Occupational History   Not on file  Tobacco Use   Smoking status: Never   Smokeless tobacco: Never  Vaping Use   Vaping Use: Never used  Substance and Sexual Activity   Alcohol use: No   Drug use: No   Sexual activity: Not Currently  Other Topics Concern   Not on file  Social History Narrative    Not on file   Social Determinants of Health   Financial Resource Strain: Not on file  Food Insecurity: Not on file  Transportation Needs: Not on file  Physical Activity: Not on file  Stress: Not on file  Social Connections: Not on file  Intimate Partner Violence: Not on file    Review  of Systems:  All other review of systems negative except as mentioned in the HPI.  Physical Exam: Vital signs BP 127/71   Pulse 63   Temp 98.2 F (36.8 C)   Resp 14   Ht '5\' 1"'$  (1.549 m)   Wt 149 lb (67.6 kg)   SpO2 95%   BMI 28.15 kg/m   General:   Alert,  Well-developed, well-nourished, pleasant and cooperative in NAD Lungs:  Clear throughout to auscultation.   Heart:  Regular rate and rhythm; no murmurs, clicks, rubs,  or gallops. Abdomen:  Soft, nontender and nondistended. Normal bowel sounds.   Neuro/Psych:  Alert and cooperative. Normal mood and affect. A and O x 3   '@Contina Strain'$  Simonne Maffucci, MD, [facility] [phone] (pager) 04/04/2022 1:57 PM@

## 2022-04-04 NOTE — Op Note (Signed)
[facility] Patient Name: Kylie Bird Procedure Date: 04/04/2022 1:44 PM MRN: [id number] Endoscopist: Gatha Mayer , MD Age: 76 Referring MD:  Date of Birth: Aug 23, 1946 Gender: Female Account #: [id number] Procedure:                Colonoscopy Indications:              Surveillance: Personal history of adenomatous                            polyps on last colonoscopy 5 years ago, Last                            colonoscopy: 2018 Medicines:                Monitored Anesthesia Care Procedure:                Pre-Anesthesia Assessment:                           - Prior to the procedure, a History and Physical                            was performed, and patient medications and                            allergies were reviewed. The patient's tolerance of                            previous anesthesia was also reviewed. The risks                            and benefits of the procedure and the sedation                            options and risks were discussed with the patient.                            All questions were answered, and informed consent                            was obtained. Prior Anticoagulants: The patient has                            taken no previous anticoagulant or antiplatelet                            agents. ASA Grade Assessment: II - A patient with                            mild systemic disease. After reviewing the risks                            and benefits, the patient was deemed in  satisfactory condition to undergo the procedure.                           After obtaining informed consent, the colonoscope                            was passed under direct vision. Throughout the                            procedure, the patient's blood pressure, pulse, and                            oxygen saturations were monitored continuously. The                            Olympus PCF-H190DL (#[id number]) Colonoscope was                             introduced through the anus and advanced to the the                            cecum, identified by appendiceal orifice and                            ileocecal valve. The colonoscopy was performed                            without difficulty. The patient tolerated the                            procedure well. The quality of the bowel                            preparation was good. The ileocecal valve,                            appendiceal orifice, and rectum were photographed.                            The bowel preparation used was SUPREP via split                            dose instruction. Scope In: 2:08:05 PM Scope Out: 2:23:14 PM Scope Withdrawal Time: 0 hours 12 minutes 25 seconds  Total Procedure Duration: 0 hours 15 minutes 9 seconds  Findings:                 The perianal and digital rectal examinations were                            normal.                           Two sessile polyps were found in the proximal  rectum and sigmoid colon. The polyps were                            diminutive in size. These polyps were removed with                            a cold snare. Resection and retrieval were                            complete. Verification of patient identification                            for the specimen was done. Estimated blood loss was                            minimal.                           Multiple diverticula were found in the sigmoid                            colon.                           The exam was otherwise without abnormality on                            direct and retroflexion views. Complications:            No immediate complications. Estimated Blood Loss:     Estimated blood loss was minimal. Impression:               - Two diminutive polyps in the proximal rectum and                            in the sigmoid colon, removed with a cold snare.                            Resected and  retrieved.                           - Diverticulosis in the sigmoid colon.                           - The examination was otherwise normal on direct                            and retroflexion views.                           - Personal history of colonic polyps 2021 mix                            adenomas and ssp's, 2018 diminutive adenoma. Recommendation:           - Patient has a contact number available for  emergencies. The signs and symptoms of potential                            delayed complications were discussed with the                            patient. Return to normal activities tomorrow.                            Written discharge instructions were provided to the                            patient.                           - Resume previous diet.                           - Continue present medications.                           - No recommendation at this time regarding repeat                            colonoscopy due to age.                           - Await pathology results. Gatha Mayer, MD 04/04/2022 2:30:21 PM This report has been signed electronically.

## 2022-04-04 NOTE — Progress Notes (Signed)
Report to PACU, RN, vss, BBS= Clear.  

## 2022-04-05 ENCOUNTER — Telehealth: Payer: Self-pay

## 2022-04-05 NOTE — Telephone Encounter (Signed)
  Follow up Call-     04/04/2022    1:15 PM  Call back number  Post procedure Call Back phone  # [phone]  Permission to leave phone message Yes    Follow up call, LVM.

## 2022-04-06 ENCOUNTER — Other Ambulatory Visit: Payer: Self-pay | Admitting: Internal Medicine

## 2022-04-06 DIAGNOSIS — Z1231 Encounter for screening mammogram for malignant neoplasm of breast: Secondary | ICD-10-CM

## 2022-04-07 DIAGNOSIS — E785 Hyperlipidemia, unspecified: Secondary | ICD-10-CM | POA: Diagnosis not present

## 2022-04-07 DIAGNOSIS — E663 Overweight: Secondary | ICD-10-CM | POA: Diagnosis not present

## 2022-04-07 DIAGNOSIS — Z1331 Encounter for screening for depression: Secondary | ICD-10-CM | POA: Diagnosis not present

## 2022-04-07 DIAGNOSIS — I10 Essential (primary) hypertension: Secondary | ICD-10-CM | POA: Diagnosis not present

## 2022-04-07 DIAGNOSIS — Z0000 Encounter for general adult medical examination without abnormal findings: Secondary | ICD-10-CM | POA: Diagnosis not present

## 2022-04-07 DIAGNOSIS — R82998 Other abnormal findings in urine: Secondary | ICD-10-CM | POA: Diagnosis not present

## 2022-04-07 DIAGNOSIS — M8580 Other specified disorders of bone density and structure, unspecified site: Secondary | ICD-10-CM | POA: Diagnosis not present

## 2022-04-07 DIAGNOSIS — R748 Abnormal levels of other serum enzymes: Secondary | ICD-10-CM | POA: Diagnosis not present

## 2022-04-07 DIAGNOSIS — Z1389 Encounter for screening for other disorder: Secondary | ICD-10-CM | POA: Diagnosis not present

## 2022-04-07 DIAGNOSIS — I83893 Varicose veins of bilateral lower extremities with other complications: Secondary | ICD-10-CM | POA: Diagnosis not present

## 2022-04-07 DIAGNOSIS — E119 Type 2 diabetes mellitus without complications: Secondary | ICD-10-CM | POA: Diagnosis not present

## 2022-04-07 DIAGNOSIS — F419 Anxiety disorder, unspecified: Secondary | ICD-10-CM | POA: Diagnosis not present

## 2022-04-08 ENCOUNTER — Encounter: Payer: Self-pay | Admitting: Internal Medicine

## 2022-05-24 ENCOUNTER — Ambulatory Visit
Admission: RE | Admit: 2022-05-24 | Discharge: 2022-05-24 | Disposition: A | Discharge status: Home / Self Care | Payer: Medicare PPO | Source: Ambulatory Visit | Attending: Internal Medicine | Admitting: Internal Medicine

## 2022-05-24 DIAGNOSIS — Z1231 Encounter for screening mammogram for malignant neoplasm of breast: Secondary | ICD-10-CM | POA: Diagnosis not present

## 2022-08-20 IMAGING — MG MM DIGITAL SCREENING BILAT W/ TOMO AND CAD
6 of 10 series · 6 of 30 positions shown · non-contrast
Comparison: Previous exam(s).

CLINICAL DATA: Screening.

EXAM:
DIGITAL SCREENING BILATERAL MAMMOGRAM WITH TOMOSYNTHESIS AND CAD
TECHNIQUE: Bilateral screening digital craniocaudal and mediolateral oblique
mammograms were obtained. Bilateral screening digital breast
tomosynthesis was performed. The images were evaluated with
computer-aided detection.

[R CC synth-2D (1 of 2)]
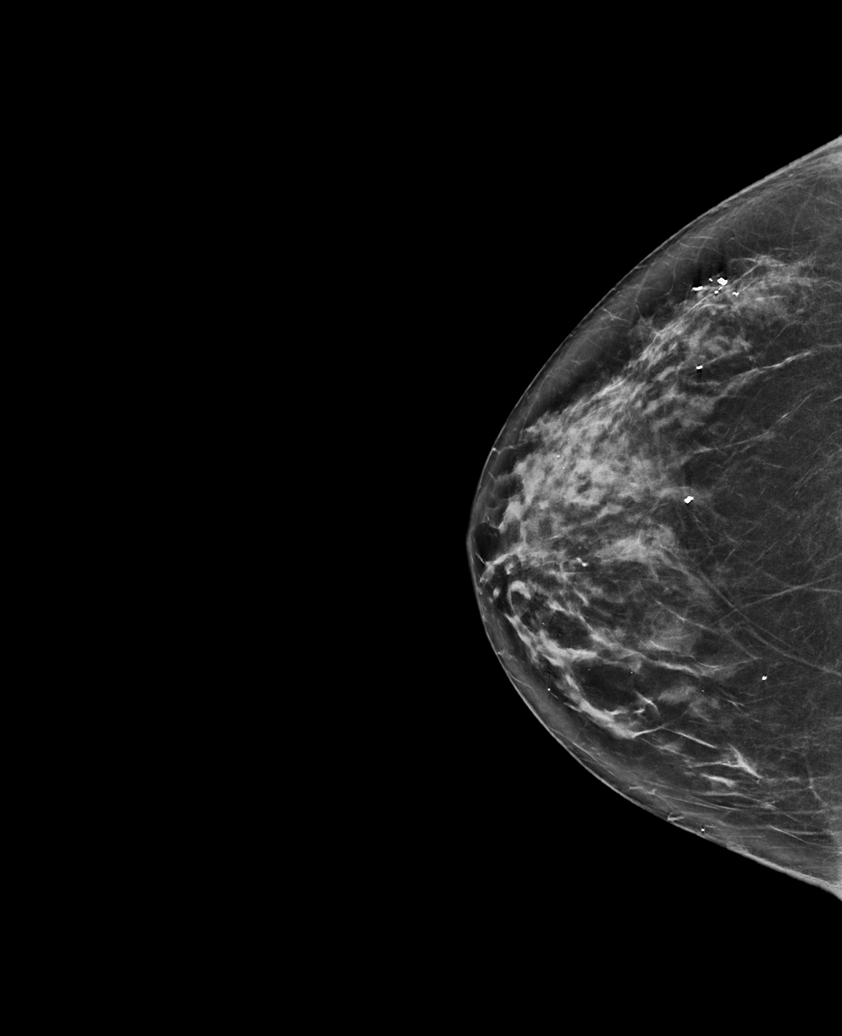

[L CC synth-2D]
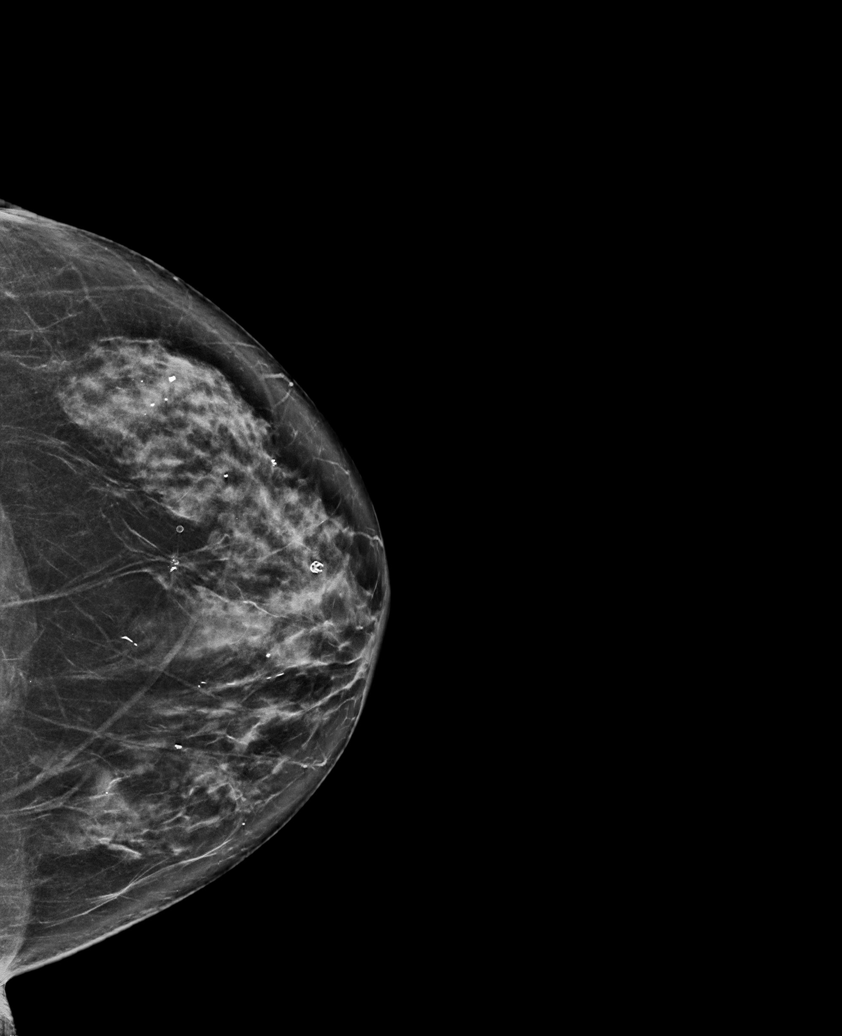

[L MLO synth-2D]
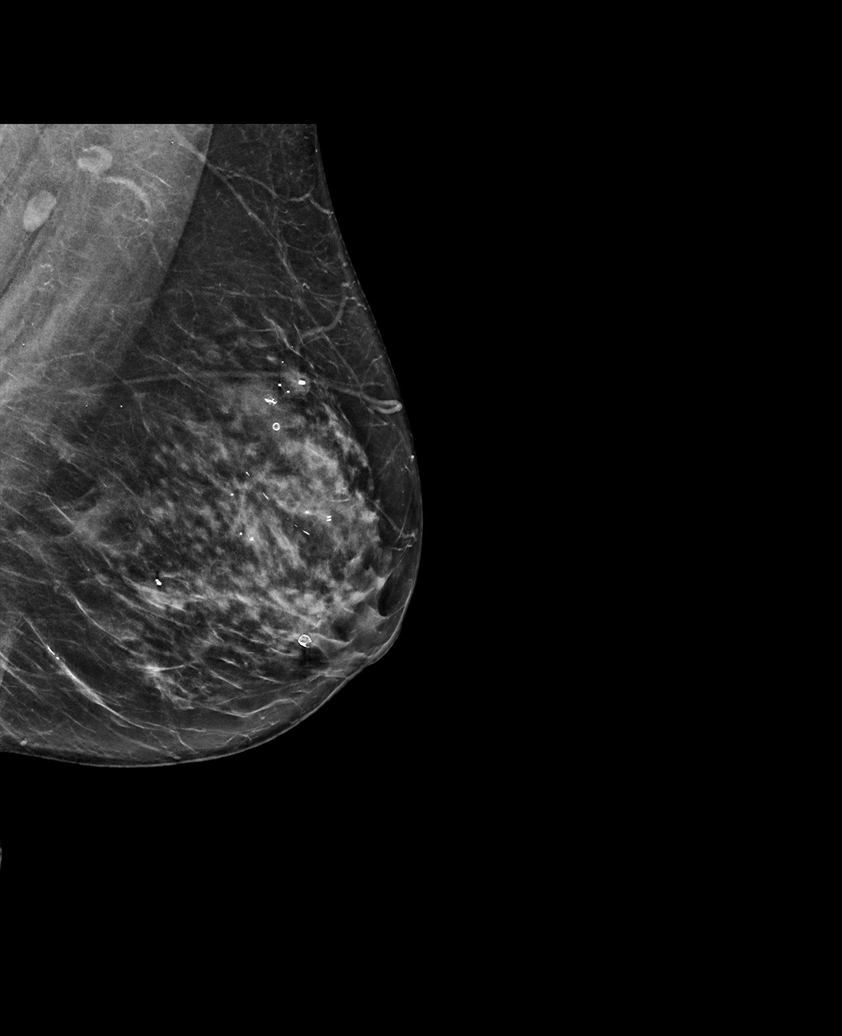

[R MLO synth-2D]
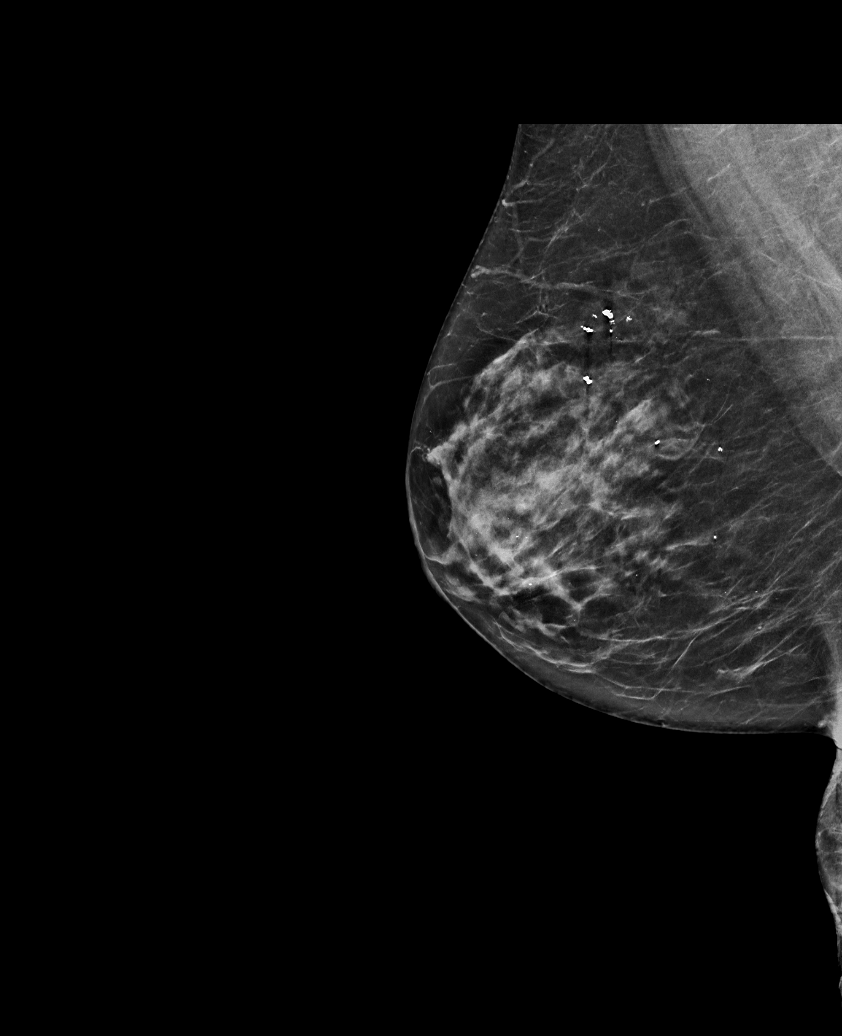

[R CC synth-2D (2 of 2)]
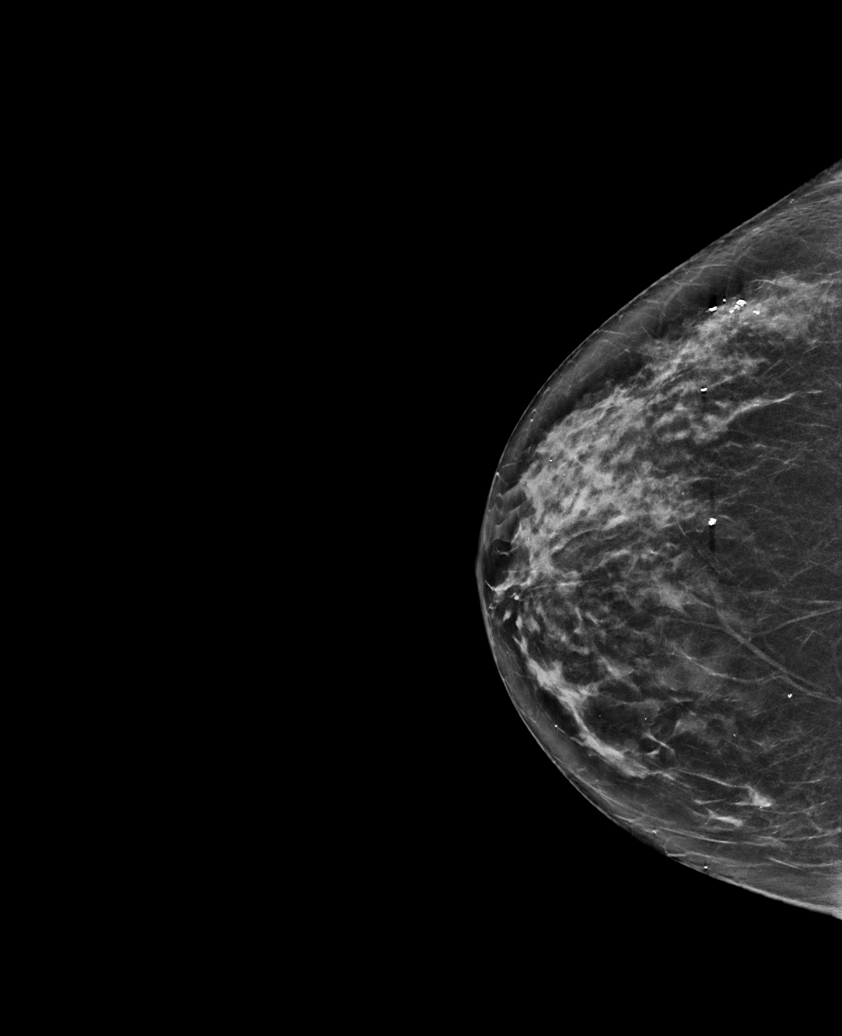

[L MLO tomo · tomo slice 37/74.0]
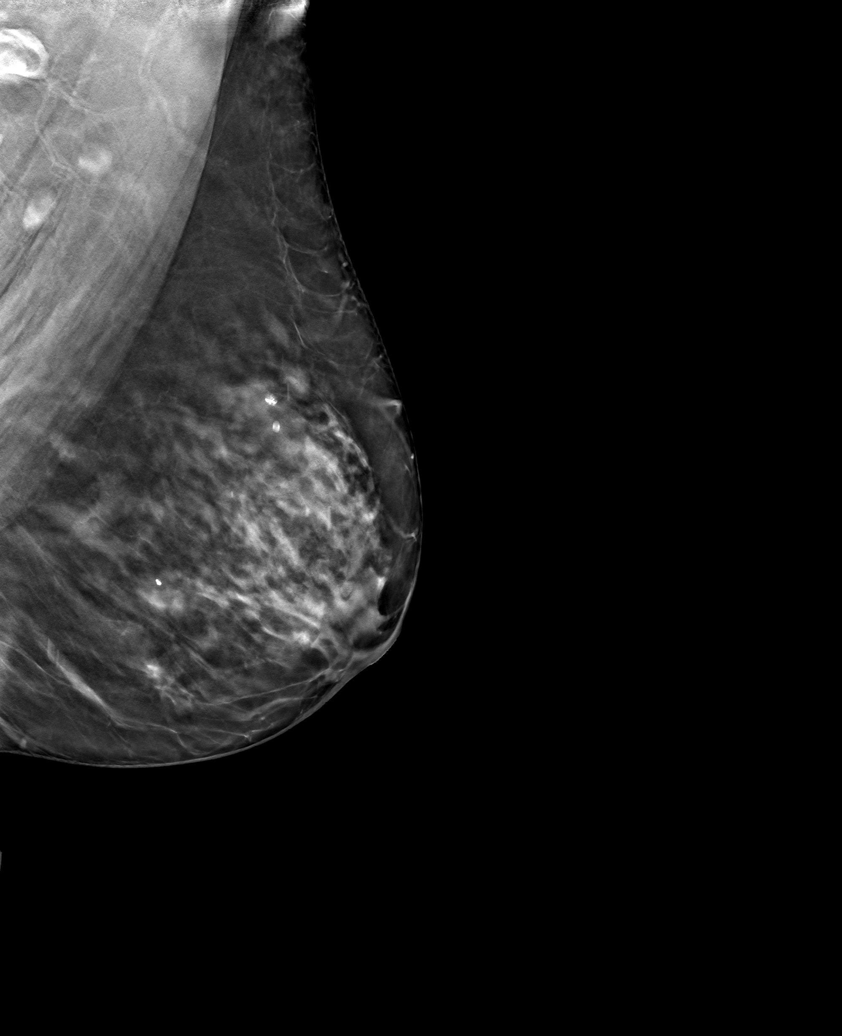

[6 of 30 positions shown; findings below may reference images not displayed]

ACR Breast Density Category c: The breast tissue is heterogeneously
dense, which may obscure small masses.
FINDINGS: There are no findings suspicious for malignancy.
IMPRESSION: No mammographic evidence of malignancy. A result letter of this
screening mammogram will be mailed directly to the patient.

RECOMMENDATION:
Screening mammogram in one year. (Code:Q3-W-BC3)

BI-RADS CATEGORY  1: Negative.

## 2022-09-02 ENCOUNTER — Encounter: Payer: Medicare Other | Admitting: Obstetrics & Gynecology

## 2022-09-05 DIAGNOSIS — H0011 Chalazion right upper eyelid: Secondary | ICD-10-CM | POA: Diagnosis not present

## 2022-09-23 ENCOUNTER — Ambulatory Visit (INDEPENDENT_AMBULATORY_CARE_PROVIDER_SITE_OTHER): Payer: Medicare PPO | Admitting: Obstetrics & Gynecology

## 2022-09-23 ENCOUNTER — Encounter: Payer: Self-pay | Admitting: Obstetrics & Gynecology

## 2022-09-23 VITALS — BP 128/82 | HR 73 | Temp 98.1°F

## 2022-09-23 DIAGNOSIS — N898 Other specified noninflammatory disorders of vagina: Secondary | ICD-10-CM

## 2022-09-23 DIAGNOSIS — R350 Frequency of micturition: Secondary | ICD-10-CM | POA: Diagnosis not present

## 2022-09-23 LAB — WET PREP FOR TRICH, YEAST, CLUE

## 2022-09-23 NOTE — Progress Notes (Signed)
    Kylie Bird Jun 16, 1946 [id number]        76 y.o.  G0P0000   RP: Irritation with urination and urinary frequency, with white vaginal discharge  HPI: Irritation with urination and urinary frequency, with white vaginal discharge.  Those symptoms come and go.  Took Azo and pushed water intake yesterday, feeling better currently.  No fever.  Postmenopause, well on no HRT.  No PMB.  Abstinent.     OB History  Gravida Para Term Preterm AB Living  0 0 0 0 0 0  SAB IAB Ectopic Multiple Live Births  0 0 0 0 0    Past medical history,surgical history, problem list, medications, allergies, family history and social history were all reviewed and documented in the EPIC chart.   Directed ROS with pertinent positives and negatives documented in the history of present illness/assessment and plan.  Exam:  Vitals:   09/23/22 1000  BP: 128/82  Pulse: 73  Temp: 98.1 F (36.7 C)  TempSrc: Oral  SpO2: 99%   General appearance:  Normal  CVAT Neg bilaterally  Abdomen: Normal  Gynecologic exam: Vulva normal.  Speculum:  Cervix/Vagina normal.  Mild vaginal discharge present.  Wet prep done.  U/A: Yellow clear, Pro Neg, Nit Neg, WBC 6-10, RBC Neg, Bacteria Few.  U. Culture pending. Wet prep: Negative   Assessment/Plan:  77 y.o. G0P0000   1. Urinary frequency Irritation with urination and urinary frequency, with white vaginal discharge.  Those symptoms come and go.  Took Azo and pushed water intake yesterday, feeling better currently.  No fever.  Postmenopause, well on no HRT.  No PMB.  Abstinent.   U/A very mildly perturbed, will wait on U. Culture to decide if ABTx is needed. - Urinalysis,Complete w/RFL Culture  2. Vaginal discharge Irritation with urination and urinary frequency, with white vaginal discharge.  Those symptoms come and go.  Took Azo and pushed water intake yesterday, feeling better currently.  No fever.  Postmenopause, well on no HRT.  No PMB.  Abstinent.  Normal gyn  exam and Negative Wet prep.  Reassured.  Recommendations to improve her vaginal flora/PH discussed.  Increase Yogurt intake and Boric Acid vaginally once a week suggested. - WET PREP FOR [place], YEAST, CLUE  Other orders - B Complex Vitamins (B COMPLEX PO); Take by mouth.   Princess Bruins MD, 10:16 AM 09/23/2022

## 2022-09-25 LAB — URINALYSIS, COMPLETE W/RFL CULTURE
Bilirubin Urine: NEGATIVE
Glucose, UA: NEGATIVE
Hgb urine dipstick: NEGATIVE
Hyaline Cast: NONE SEEN /LPF
Ketones, ur: NEGATIVE
Nitrites, Initial: NEGATIVE
Protein, ur: NEGATIVE
RBC / HPF: NONE SEEN /HPF (ref 0–2)
Specific Gravity, Urine: 1.005 (ref 1.001–1.035)
pH: 5.5 (ref 5.0–8.0)

## 2022-09-25 LAB — URINE CULTURE
MICRO NUMBER:: 14393941
Result:: NO GROWTH
SPECIMEN QUALITY:: ADEQUATE

## 2022-09-25 LAB — CULTURE INDICATED

## 2022-10-25 DIAGNOSIS — F419 Anxiety disorder, unspecified: Secondary | ICD-10-CM | POA: Diagnosis not present

## 2022-10-25 DIAGNOSIS — E119 Type 2 diabetes mellitus without complications: Secondary | ICD-10-CM | POA: Diagnosis not present

## 2022-10-25 DIAGNOSIS — I77819 Aortic ectasia, unspecified site: Secondary | ICD-10-CM | POA: Diagnosis not present

## 2022-10-25 DIAGNOSIS — R030 Elevated blood-pressure reading, without diagnosis of hypertension: Secondary | ICD-10-CM | POA: Diagnosis not present

## 2022-10-25 DIAGNOSIS — M8589 Other specified disorders of bone density and structure, multiple sites: Secondary | ICD-10-CM | POA: Diagnosis not present

## 2022-10-25 DIAGNOSIS — I10 Essential (primary) hypertension: Secondary | ICD-10-CM | POA: Diagnosis not present

## 2022-10-25 DIAGNOSIS — M1990 Unspecified osteoarthritis, unspecified site: Secondary | ICD-10-CM | POA: Diagnosis not present

## 2022-10-25 DIAGNOSIS — E785 Hyperlipidemia, unspecified: Secondary | ICD-10-CM | POA: Diagnosis not present

## 2022-10-25 DIAGNOSIS — R748 Abnormal levels of other serum enzymes: Secondary | ICD-10-CM | POA: Diagnosis not present

## 2022-10-25 DIAGNOSIS — E871 Hypo-osmolality and hyponatremia: Secondary | ICD-10-CM | POA: Diagnosis not present

## 2022-11-07 DIAGNOSIS — H524 Presbyopia: Secondary | ICD-10-CM | POA: Diagnosis not present

## 2022-11-07 DIAGNOSIS — Z135 Encounter for screening for eye and ear disorders: Secondary | ICD-10-CM | POA: Diagnosis not present

## 2022-11-07 DIAGNOSIS — H5213 Myopia, bilateral: Secondary | ICD-10-CM | POA: Diagnosis not present

## 2022-11-07 DIAGNOSIS — H52223 Regular astigmatism, bilateral: Secondary | ICD-10-CM | POA: Diagnosis not present

## 2022-11-07 DIAGNOSIS — H2513 Age-related nuclear cataract, bilateral: Secondary | ICD-10-CM | POA: Diagnosis not present

## 2023-04-07 DIAGNOSIS — H0011 Chalazion right upper eyelid: Secondary | ICD-10-CM | POA: Diagnosis not present

## 2023-04-21 DIAGNOSIS — E785 Hyperlipidemia, unspecified: Secondary | ICD-10-CM | POA: Diagnosis not present

## 2023-04-21 DIAGNOSIS — I10 Essential (primary) hypertension: Secondary | ICD-10-CM | POA: Diagnosis not present

## 2023-04-21 DIAGNOSIS — M8580 Other specified disorders of bone density and structure, unspecified site: Secondary | ICD-10-CM | POA: Diagnosis not present

## 2023-04-21 DIAGNOSIS — E871 Hypo-osmolality and hyponatremia: Secondary | ICD-10-CM | POA: Diagnosis not present

## 2023-04-21 DIAGNOSIS — Z1212 Encounter for screening for malignant neoplasm of rectum: Secondary | ICD-10-CM | POA: Diagnosis not present

## 2023-04-21 DIAGNOSIS — E119 Type 2 diabetes mellitus without complications: Secondary | ICD-10-CM | POA: Diagnosis not present

## 2023-04-25 ENCOUNTER — Other Ambulatory Visit: Payer: Self-pay | Admitting: Internal Medicine

## 2023-04-25 DIAGNOSIS — Z1231 Encounter for screening mammogram for malignant neoplasm of breast: Secondary | ICD-10-CM

## 2023-05-03 DIAGNOSIS — K529 Noninfective gastroenteritis and colitis, unspecified: Secondary | ICD-10-CM | POA: Diagnosis not present

## 2023-05-03 DIAGNOSIS — R82998 Other abnormal findings in urine: Secondary | ICD-10-CM | POA: Diagnosis not present

## 2023-05-03 DIAGNOSIS — Z1331 Encounter for screening for depression: Secondary | ICD-10-CM | POA: Diagnosis not present

## 2023-05-03 DIAGNOSIS — E119 Type 2 diabetes mellitus without complications: Secondary | ICD-10-CM | POA: Diagnosis not present

## 2023-05-03 DIAGNOSIS — I10 Essential (primary) hypertension: Secondary | ICD-10-CM | POA: Diagnosis not present

## 2023-05-03 DIAGNOSIS — E781 Pure hyperglyceridemia: Secondary | ICD-10-CM | POA: Diagnosis not present

## 2023-05-03 DIAGNOSIS — E785 Hyperlipidemia, unspecified: Secondary | ICD-10-CM | POA: Diagnosis not present

## 2023-05-03 DIAGNOSIS — M8580 Other specified disorders of bone density and structure, unspecified site: Secondary | ICD-10-CM | POA: Diagnosis not present

## 2023-05-03 DIAGNOSIS — F419 Anxiety disorder, unspecified: Secondary | ICD-10-CM | POA: Diagnosis not present

## 2023-05-03 DIAGNOSIS — Z0000 Encounter for general adult medical examination without abnormal findings: Secondary | ICD-10-CM | POA: Diagnosis not present

## 2023-05-26 ENCOUNTER — Ambulatory Visit
Admission: RE | Admit: 2023-05-26 | Discharge: 2023-05-26 | Disposition: A | Discharge status: Home / Self Care | Payer: Medicare PPO | Source: Ambulatory Visit | Attending: Internal Medicine | Admitting: Internal Medicine

## 2023-05-26 DIAGNOSIS — Z1231 Encounter for screening mammogram for malignant neoplasm of breast: Secondary | ICD-10-CM | POA: Diagnosis not present

## 2023-10-19 DIAGNOSIS — H04123 Dry eye syndrome of bilateral lacrimal glands: Secondary | ICD-10-CM | POA: Diagnosis not present

## 2023-10-19 DIAGNOSIS — H5213 Myopia, bilateral: Secondary | ICD-10-CM | POA: Diagnosis not present

## 2023-10-19 DIAGNOSIS — H52223 Regular astigmatism, bilateral: Secondary | ICD-10-CM | POA: Diagnosis not present

## 2023-10-19 DIAGNOSIS — H524 Presbyopia: Secondary | ICD-10-CM | POA: Diagnosis not present

## 2023-10-19 DIAGNOSIS — H2513 Age-related nuclear cataract, bilateral: Secondary | ICD-10-CM | POA: Diagnosis not present

## 2023-11-03 DIAGNOSIS — F419 Anxiety disorder, unspecified: Secondary | ICD-10-CM | POA: Diagnosis not present

## 2023-11-03 DIAGNOSIS — E781 Pure hyperglyceridemia: Secondary | ICD-10-CM | POA: Diagnosis not present

## 2023-11-03 DIAGNOSIS — J069 Acute upper respiratory infection, unspecified: Secondary | ICD-10-CM | POA: Diagnosis not present

## 2023-11-03 DIAGNOSIS — M8580 Other specified disorders of bone density and structure, unspecified site: Secondary | ICD-10-CM | POA: Diagnosis not present

## 2023-11-03 DIAGNOSIS — I10 Essential (primary) hypertension: Secondary | ICD-10-CM | POA: Diagnosis not present

## 2023-11-03 DIAGNOSIS — K529 Noninfective gastroenteritis and colitis, unspecified: Secondary | ICD-10-CM | POA: Diagnosis not present

## 2023-11-03 DIAGNOSIS — E785 Hyperlipidemia, unspecified: Secondary | ICD-10-CM | POA: Diagnosis not present

## 2023-11-03 DIAGNOSIS — I77819 Aortic ectasia, unspecified site: Secondary | ICD-10-CM | POA: Diagnosis not present

## 2023-11-03 DIAGNOSIS — E119 Type 2 diabetes mellitus without complications: Secondary | ICD-10-CM | POA: Diagnosis not present

## 2024-01-10 ENCOUNTER — Encounter: Attending: Internal Medicine | Admitting: Dietician

## 2024-01-10 ENCOUNTER — Encounter: Payer: Self-pay | Admitting: Dietician

## 2024-01-10 VITALS — Wt 141.5 lb

## 2024-01-10 DIAGNOSIS — E119 Type 2 diabetes mellitus without complications: Secondary | ICD-10-CM | POA: Insufficient documentation

## 2024-01-10 NOTE — Progress Notes (Signed)
 Diabetes Self-Management Education  Visit Type: First/Initial  Appt. Start Time: 1515 Appt. End Time: 1620  01/10/2024  [name], identified by name and date of birth, is a 78 y.o. female with a diagnosis of Diabetes: Type 2.   ASSESSMENT  History includes: anxiety, HLD, HTN, type 2 diabetes Labs noted: 11/03/23: A1c 10.5 Medications include: reviewed Supplements: juice plus fiber vitamin, vitamin d3 + k2, turmeric, omega 3.  Pt states she doesn't eat very much sugar. Pt reports she has changed her eating habits. Pt with A1c history since 2017 of fluctuating around 6-8% with sudden jump to 10.5% in February 2025. Pt states at this time she was having higher stress due to death of her brother and having to clean out his house with her sisters. Pt reports mom had type 2 diabetes and sister has type 2.   Pt states she has been checking her blood glucose consistently and reports her meter is estimating A1c around 7.5%. Pt reports fasting blood glucose has been around 115mg /dL. Pt did not bring logs to this visit.   Pt reports she spends a lot of her time working in her yard doing yard work and gardening. Pt reports this tires her. Pt states in the winter she will go to the gym some days. Pt participates in bowling on Thursdays and helps with her church.   Weight 141 lb 8 oz (64.2 kg). Body mass index is 26.74 kg/m.   Diabetes Self-Management Education - 01/10/24 1516       Visit Information   Visit Type First/Initial      Initial Visit   Diabetes Type Type 2    Date Diagnosed 2017?    Are you currently following a meal plan? No    Are you taking your medications as prescribed? Not on Medications      Health Coping   How would you rate your overall health? Good      Psychosocial Assessment   Patient Belief/Attitude about Diabetes Motivated to manage diabetes    What is the hardest part about your diabetes right now, causing you the most concern, or is the most worrisome  to you about your diabetes?   Making healty food and beverage choices;Getting support / problem solving    Self-care barriers None    Self-management support Doctor's office    Other persons present Patient    Patient Concerns Nutrition/Meal planning    Special Needs None    Preferred Learning Style No preference indicated    Learning Readiness Ready    How often do you need to have someone help you when you read instructions, pamphlets, or other written materials from your doctor or pharmacy? 1 - Never    What is the last grade level you completed in school? masters      Pre-Education Assessment   Patient understands the diabetes disease and treatment process. Needs Instruction    Patient understands incorporating nutritional management into lifestyle. Needs Instruction    Patient undertands incorporating physical activity into lifestyle. Needs Instruction    Patient understands using medications safely. Needs Instruction    Patient understands monitoring blood glucose, interpreting and using results Needs Instruction    Patient understands prevention, detection, and treatment of acute complications. Needs Instruction    Patient understands prevention, detection, and treatment of chronic complications. Needs Instruction    Patient understands how to develop strategies to address psychosocial issues. Needs Instruction    Patient understands how to develop strategies to promote  health/change behavior. Needs Instruction      Complications   Last HgB A1C per patient/outside source 10.5 %    How often do you check your blood sugar? 1-2 times/day    Fasting Blood glucose range (mg/dL) 21-308    Have you had a dilated eye exam in the past 12 months? Yes    Have you had a dental exam in the past 12 months? Yes    Are you checking your feet? No      Dietary Intake   Breakfast pea protein and water shake    Snack (morning) none    Lunch 3 oz meat, vegetables, and fruit    Snack (afternoon)  none or 1/2 apple    Dinner 3 oz meat, vegetables    Snack (evening) none    Beverage(s) 72-80 oz water, unsweet tea      Activity / Exercise   Activity / Exercise Type Light (walking / raking leaves)    How many days per week do you exercise? 3    How many minutes per day do you exercise? 30    Total minutes per week of exercise 90      Patient Education   Previous Diabetes Education No    Disease Pathophysiology Definition of diabetes, type 1 and 2, and the diagnosis of diabetes;Explored patient's options for treatment of their diabetes    Healthy Eating Role of diet in the treatment of diabetes and the relationship between the three main macronutrients and blood glucose level;Plate Method;Reviewed blood glucose goals for pre and post meals and how to evaluate the patients' food intake on their blood glucose level.;Meal options for control of blood glucose level and chronic complications.    Being Active Identified with patient nutritional and/or medication changes necessary with exercise.    Monitoring Purpose and frequency of SMBG.;Taught/evaluated SMBG meter.;Daily foot exams;Yearly dilated eye exam    Acute complications Taught prevention, symptoms, and  treatment of hypoglycemia - the 15 rule.;Discussed and identified patients' prevention, symptoms, and treatment of hyperglycemia.    Chronic complications Relationship between chronic complications and blood glucose control;Identified and discussed with patient  current chronic complications    Diabetes Stress and Support Identified and addressed patients feelings and concerns about diabetes;Role of stress on diabetes;Worked with patient to identify barriers to care and solutions    Lifestyle and Health Coping Lifestyle issues that need to be addressed for better diabetes care      Individualized Goals (developed by patient)   Nutrition General guidelines for healthy choices and portions discussed    Physical Activity Exercise 3-5 times  per week;45 minutes per day    Medications Not Applicable    Monitoring  Test my blood glucose as discussed    Problem Solving Eating Pattern    Reducing Risk examine blood glucose patterns;do foot checks daily;treat hypoglycemia with 15 grams of carbs if blood glucose less than 70mg /dL    Health Coping Ask for help with psychological, social, or emotional issues      Post-Education Assessment   Patient understands the diabetes disease and treatment process. Comprehends key points    Patient understands incorporating nutritional management into lifestyle. Comprehends key points    Patient undertands incorporating physical activity into lifestyle. Comprehends key points    Patient understands using medications safely. Comphrehends key points    Patient understands monitoring blood glucose, interpreting and using results Comprehends key points    Patient understands prevention, detection, and treatment of acute complications. Comprehends  key points    Patient understands prevention, detection, and treatment of chronic complications. Comprehends key points    Patient understands how to develop strategies to address psychosocial issues. Comprehends key points    Patient understands how to develop strategies to promote health/change behavior. Comprehends key points      Outcomes   Expected Outcomes Demonstrated interest in learning. Expect positive outcomes    Future DMSE 3-4 months    Program Status Not Completed             Individualized Plan for Diabetes Self-Management Training:   Learning Objective:  Patient will have a greater understanding of diabetes self-management. Patient education plan is to attend individual and/or group sessions per assessed needs and concerns.   Plan:   Patient Instructions  Goals Established by Patient:  Goal 1: choose a hobby for stress relief.  Goal 2: aim for 6-7 hours of sleep.   Expected Outcomes:  Demonstrated interest in learning. Expect  positive outcomes  Education material provided: ADA - How to Thrive: A Guide for Your Journey with Diabetes and My Plate  If problems or questions, patient to contact team via:  Phone  Future DSME appointment: 3-4 months

## 2024-01-10 NOTE — Patient Instructions (Signed)
 Goals Established by Patient:  Goal 1: choose a hobby for stress relief.  Goal 2: aim for 6-7 hours of sleep.

## 2024-02-05 DIAGNOSIS — M1990 Unspecified osteoarthritis, unspecified site: Secondary | ICD-10-CM | POA: Diagnosis not present

## 2024-02-05 DIAGNOSIS — R3589 Other polyuria: Secondary | ICD-10-CM | POA: Diagnosis not present

## 2024-02-05 DIAGNOSIS — M8580 Other specified disorders of bone density and structure, unspecified site: Secondary | ICD-10-CM | POA: Diagnosis not present

## 2024-02-05 DIAGNOSIS — E119 Type 2 diabetes mellitus without complications: Secondary | ICD-10-CM | POA: Diagnosis not present

## 2024-02-05 DIAGNOSIS — E781 Pure hyperglyceridemia: Secondary | ICD-10-CM | POA: Diagnosis not present

## 2024-02-05 DIAGNOSIS — E785 Hyperlipidemia, unspecified: Secondary | ICD-10-CM | POA: Diagnosis not present

## 2024-02-05 DIAGNOSIS — K529 Noninfective gastroenteritis and colitis, unspecified: Secondary | ICD-10-CM | POA: Diagnosis not present

## 2024-02-05 DIAGNOSIS — F419 Anxiety disorder, unspecified: Secondary | ICD-10-CM | POA: Diagnosis not present

## 2024-02-05 DIAGNOSIS — I10 Essential (primary) hypertension: Secondary | ICD-10-CM | POA: Diagnosis not present

## 2024-04-10 ENCOUNTER — Encounter: Attending: Internal Medicine | Admitting: Dietician

## 2024-04-10 ENCOUNTER — Encounter: Payer: Self-pay | Admitting: Dietician

## 2024-04-10 DIAGNOSIS — E119 Type 2 diabetes mellitus without complications: Secondary | ICD-10-CM | POA: Diagnosis not present

## 2024-04-10 NOTE — Progress Notes (Signed)
 Diabetes Self-Management Education  Visit Type: Follow-up  Appt. Start Time: 1515 Appt. End Time: 1545  04/10/2024  [name], identified by name and date of birth, is a 78 y.o. female with a diagnosis of Diabetes: type 2  .   ASSESSMENT  History includes: anxiety, HLD, HTN, type 2 diabetes Labs noted: 11/03/23: A1c 10.5 Medications include: reviewed Supplements: juice plus fiber vitamin, vitamin d3 + k2, turmeric, omega 3.   Pt reports she has lost 10-15 lb. Pt reports she is working with a doctor who created a diet for her. Pt reports she has a protein shake for breakfast, and for lunch and dinner a protein, vegetables and a fruit. Pt reports she will snack on almonds or a fruit between meals if hungry.   Pt states she is checking her blood glucose daily fasting and it is usually between 118-125mg /dL. Pt reports she noticed her blood glucose goes up in response to stress.   Pt reports she worked toward setting boundaries to reduce stress in her life. 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   Pt reports she enjoys working in her yard. Pt reports if she goes too long without eating she will get signs of hypoglycemia but states her blood glucose was 88mg /dL when this happened.   Assessment of Previous Goals Established by Patient:   Goal 1: choose a hobby for stress relief. - goal met, Sunday school, bowling on Thursdays, dinner with friends.    Goal 2: aim for 6-7 hours of sleep. - goal met, getting 7-8 hours.   There were no vitals taken for this visit. There is no height or weight on file to calculate BMI.   Diabetes Self-Management Education - 04/10/24 1512       Visit Information   Visit Type Follow-up      Health Coping   How would you rate your overall health? Good      Psychosocial Assessment   Patient Belief/Attitude about Diabetes Motivated to manage diabetes     What is the hardest part about your diabetes right now, causing you the most concern, or is the most worrisome to you about your diabetes?   Making healty food and beverage choices    Self-care barriers None    Self-management support Doctor's office    Other persons present Patient    Patient Concerns Nutrition/Meal planning    Special Needs None    Preferred Learning Style No preference indicated    Learning Readiness Ready      Pre-Education Assessment   Patient understands the diabetes disease and treatment process. Needs Review    Patient understands incorporating nutritional management into lifestyle. Needs Review    Patient undertands incorporating physical activity into lifestyle. Needs Review    Patient understands using medications safely. Needs Review    Patient understands monitoring blood glucose, interpreting and using results Needs Review    Patient understands prevention, detection, and treatment of acute complications. Needs Review    Patient understands prevention, detection, and treatment of chronic complications. Needs Review    Patient understands how to develop strategies to address psychosocial issues. Needs Review    Patient understands how to develop strategies to promote health/change behavior. Needs Review      Dietary Intake   Breakfast protein shake with 1/2 c fruit    Snack (morning) none    Lunch 3 oz chicken with a salad  and tomato and apple    Snack (afternoon) 20 almonds    Dinner 3 oz chicken with a salad and tomato and apple    Snack (evening) popcorn    Beverage(s) water, unsweet tea      Activity / Exercise   Activity / Exercise Type Light (walking / raking leaves)      Patient Education   Previous Diabetes Education Yes    Disease Pathophysiology Explored patient's options for treatment of their diabetes    Healthy Eating Role of diet in the treatment of diabetes and the relationship between the three main macronutrients and blood glucose  level;Plate Method;Information on hints to eating out and maintain blood glucose control.;Meal options for control of blood glucose level and chronic complications.;Reviewed blood glucose goals for pre and post meals and how to evaluate the patients' food intake on their blood glucose level.    Being Active Role of exercise on diabetes management, blood pressure control and cardiac health.;Helped patient identify appropriate exercises in relation to his/her diabetes, diabetes complications and other health issue.    Monitoring Identified appropriate SMBG and/or A1C goals.;Purpose and frequency of SMBG.    Acute complications Taught prevention, symptoms, and  treatment of hypoglycemia - the 15 rule.    Chronic complications Relationship between chronic complications and blood glucose control;Identified and discussed with patient  current chronic complications    Diabetes Stress and Support Identified and addressed patients feelings and concerns about diabetes;Role of stress on diabetes;Worked with patient to identify barriers to care and solutions    Lifestyle and Health Coping Lifestyle issues that need to be addressed for better diabetes care      Individualized Goals (developed by patient)   Nutrition General guidelines for healthy choices and portions discussed    Physical Activity Exercise 3-5 times per week;15 minutes per day    Medications take my medication as prescribed    Monitoring  Test my blood glucose as discussed    Problem Solving Eating Pattern    Reducing Risk examine blood glucose patterns;do foot checks daily;treat hypoglycemia with 15 grams of carbs if blood glucose less than 70mg /dL    Health Coping Ask for help with psychological, social, or emotional issues      Patient Self-Evaluation of Goals - Patient rates self as meeting previously set goals (% of time)   Nutrition >75% (most of the time)    Physical Activity 50 - 75 % (half of the time)    Medications Not Applicable     Monitoring 50 - 75 % (half of the time)    Problem Solving and behavior change strategies  >75% (most of the time)    Reducing Risk (treating acute and chronic complications) >75% (most of the time)    Health Coping >75% (most of the time)      Post-Education Assessment   Patient understands the diabetes disease and treatment process. Comprehends key points    Patient understands incorporating nutritional management into lifestyle. Comprehends key points    Patient undertands incorporating physical activity into lifestyle. Comprehends key points    Patient understands using medications safely. Comphrehends key points    Patient understands monitoring blood glucose, interpreting and using results Comprehends key points    Patient understands prevention, detection, and treatment of acute complications. Comprehends key points    Patient understands prevention, detection, and treatment of chronic complications. Comprehends key points    Patient understands how to develop strategies to address psychosocial issues. Comprehends key points  Patient understands how to develop strategies to promote health/change behavior. Comprehends key points      Outcomes   Expected Outcomes Demonstrated interest in learning. Expect positive outcomes    Future DMSE PRN    Program Status Completed      Subsequent Visit   Since your last visit have you continued or begun to take your medications as prescribed? Not on Medications    Since your last visit have you had your blood pressure checked? No    Since your last visit, are you checking your blood glucose at least once a day? Yes          Individualized Plan for Diabetes Self-Management Training:   Learning Objective:  Patient will have a greater understanding of diabetes self-management. Patient education plan is to attend individual and/or group sessions per assessed needs and concerns.   Plan:   There are no Patient Instructions on file for  this visit.  Expected Outcomes:  Demonstrated interest in learning. Expect positive outcomes  Education material provided: ADA - How to Thrive: A Guide for Your Journey with Diabetes and My Plate  If problems or questions, patient to contact team via:  Phone  Future DSME appointment: PRN

## 2024-04-23 ENCOUNTER — Other Ambulatory Visit: Payer: Self-pay | Admitting: Internal Medicine

## 2024-04-23 DIAGNOSIS — Z1231 Encounter for screening mammogram for malignant neoplasm of breast: Secondary | ICD-10-CM

## 2024-05-06 DIAGNOSIS — E119 Type 2 diabetes mellitus without complications: Secondary | ICD-10-CM | POA: Diagnosis not present

## 2024-05-06 DIAGNOSIS — M8580 Other specified disorders of bone density and structure, unspecified site: Secondary | ICD-10-CM | POA: Diagnosis not present

## 2024-05-06 DIAGNOSIS — E781 Pure hyperglyceridemia: Secondary | ICD-10-CM | POA: Diagnosis not present

## 2024-05-06 DIAGNOSIS — E785 Hyperlipidemia, unspecified: Secondary | ICD-10-CM | POA: Diagnosis not present

## 2024-05-06 DIAGNOSIS — I10 Essential (primary) hypertension: Secondary | ICD-10-CM | POA: Diagnosis not present

## 2024-05-13 DIAGNOSIS — R3589 Other polyuria: Secondary | ICD-10-CM | POA: Diagnosis not present

## 2024-05-13 DIAGNOSIS — Z1212 Encounter for screening for malignant neoplasm of rectum: Secondary | ICD-10-CM | POA: Diagnosis not present

## 2024-05-13 DIAGNOSIS — F419 Anxiety disorder, unspecified: Secondary | ICD-10-CM | POA: Diagnosis not present

## 2024-05-13 DIAGNOSIS — R82998 Other abnormal findings in urine: Secondary | ICD-10-CM | POA: Diagnosis not present

## 2024-05-13 DIAGNOSIS — K529 Noninfective gastroenteritis and colitis, unspecified: Secondary | ICD-10-CM | POA: Diagnosis not present

## 2024-05-13 DIAGNOSIS — I83893 Varicose veins of bilateral lower extremities with other complications: Secondary | ICD-10-CM | POA: Diagnosis not present

## 2024-05-13 DIAGNOSIS — E785 Hyperlipidemia, unspecified: Secondary | ICD-10-CM | POA: Diagnosis not present

## 2024-05-13 DIAGNOSIS — E119 Type 2 diabetes mellitus without complications: Secondary | ICD-10-CM | POA: Diagnosis not present

## 2024-05-13 DIAGNOSIS — Z0000 Encounter for general adult medical examination without abnormal findings: Secondary | ICD-10-CM | POA: Diagnosis not present

## 2024-05-13 DIAGNOSIS — M8580 Other specified disorders of bone density and structure, unspecified site: Secondary | ICD-10-CM | POA: Diagnosis not present

## 2024-05-13 DIAGNOSIS — I10 Essential (primary) hypertension: Secondary | ICD-10-CM | POA: Diagnosis not present

## 2024-05-27 ENCOUNTER — Ambulatory Visit
Admission: RE | Admit: 2024-05-27 | Discharge: 2024-05-27 | Disposition: A | Discharge status: Home / Self Care | Source: Ambulatory Visit | Attending: Internal Medicine | Admitting: Internal Medicine

## 2024-05-27 DIAGNOSIS — Z1231 Encounter for screening mammogram for malignant neoplasm of breast: Secondary | ICD-10-CM

## 2024-10-08 ENCOUNTER — Encounter: Payer: Self-pay | Admitting: Radiology

## 2024-10-08 ENCOUNTER — Ambulatory Visit: Admitting: Radiology

## 2024-10-08 VITALS — BP 170/90 | HR 87 | Ht 61.25 in | Wt 142.0 lb

## 2024-10-08 DIAGNOSIS — Z01419 Encounter for gynecological examination (general) (routine) without abnormal findings: Secondary | ICD-10-CM

## 2024-10-08 DIAGNOSIS — R030 Elevated blood-pressure reading, without diagnosis of hypertension: Secondary | ICD-10-CM

## 2024-10-08 NOTE — Progress Notes (Signed)
" ° °  RICCI PAFF [birth year] [id number]   History: Postmenopausal 79 y.o. presents for annual exam. No gyn concerns.  Risk Factors for Medicare Patients >/= 5 sexual partners in a lifetime: No  First intercourse <79 years of age: No  H/O STD at any age: No  Abnormal pap smear, < 3 negative paps within the last 7 years: No  DES exposure (women born between [phone]): No  Patient is on post breast cancer medication like Femara or, if medication like this is not needed, 5 years post breast cancer diagnosis: No   Gynecologic History Postmenopausal Last Pap: 2022 at [facility]. Results were: normal Last mammogram: 05/27/24. Results were: normal Last colonoscopy: 2023 DEXA:2025 osteopenia, managed by PCP   Obstetric History OB History  Gravida Para Term Preterm AB Living  0 0 0 0 0 0  SAB IAB Ectopic Multiple Live Births  0 0 0 0 0     The following portions of the patient's history were reviewed and updated as appropriate: allergies, current medications, past family history, past medical history, past social history, past surgical history, and problem list.  Review of Systems Pertinent items noted in HPI and remainder of comprehensive ROS otherwise negative.  Past medical history, past surgical history, family history and social history were all reviewed and documented in the EPIC chart.  Exam:  Vitals:   10/08/24 1152 10/08/24 1201  BP: (!) 154/92 (!) 170/90  Pulse: 87   SpO2: 99%   Weight: 142 lb (64.4 kg)   Height: 5' 1.25 (1.556 m)    Body mass index is 26.61 kg/m.  General appearance:  Normal Thyroid :  Symmetrical, normal in size, without palpable masses or nodularity. Respiratory  Auscultation:  Clear without wheezing or rhonchi Cardiovascular  Auscultation:  Regular rate, without rubs, murmurs or gallops  Edema/varicosities:  Not grossly evident Abdominal  Soft,nontender, without masses, guarding or rebound.  Liver/spleen:  No organomegaly noted  Hernia:  None  appreciated  Skin  Inspection:  Grossly normal Breasts: Examined lying and sitting.   Right: Without masses, retractions, nipple discharge or axillary adenopathy.   Left: Without masses, retractions, nipple discharge or axillary adenopathy. Genitourinary   Inguinal/mons:  Normal without inguinal adenopathy  External genitalia:  Normal appearing vulva with no masses, tenderness, or lesions  BUS/Urethra/Skene's glands:  Normal  Vagina:  Normal appearing with normal color and discharge, no lesions. Atrophy: moderate   Cervix:  Normal appearing without discharge or lesions  Uterus:  Normal in size, shape and contour.  Midline and mobile, nontender  Adnexa/parametria:     Rt: Normal in size, without masses or tenderness.   Lt: Normal in size, without masses or tenderness.  Anus and perineum: Normal    Darice Hoit, CMA present for exam  Assessment/Plan:   1. Encounter for breast and pelvic examination (Primary) Return in 2 years Mammogram yearly  2. Elevated blood pressure reading Continue to monitor- follow up with PCP if it remains elevated     Nakari Bracknell B WHNP-BC, 12:03 PM 10/08/2024 "
